# Patient Record
Sex: Male | Born: 1965 | Race: Black or African American | Hispanic: No | Marital: Married | Smoking: Current every day smoker
Health system: Southern US, Community
[De-identification: ages and names within clinical notes are randomized; demographics above are authoritative.]

## PROBLEM LIST (undated history)

## (undated) DIAGNOSIS — K219 Gastro-esophageal reflux disease without esophagitis: Secondary | ICD-10-CM

## (undated) DIAGNOSIS — I10 Essential (primary) hypertension: Secondary | ICD-10-CM

## (undated) DIAGNOSIS — R42 Dizziness and giddiness: Secondary | ICD-10-CM

## (undated) DIAGNOSIS — K635 Polyp of colon: Secondary | ICD-10-CM

## (undated) DIAGNOSIS — R0981 Nasal congestion: Secondary | ICD-10-CM

## (undated) DIAGNOSIS — J309 Allergic rhinitis, unspecified: Secondary | ICD-10-CM

## (undated) DIAGNOSIS — Z8249 Family history of ischemic heart disease and other diseases of the circulatory system: Secondary | ICD-10-CM

## (undated) DIAGNOSIS — H04203 Unspecified epiphora, bilateral lacrimal glands: Secondary | ICD-10-CM

## (undated) HISTORY — DX: Dizziness and giddiness: R42

## (undated) HISTORY — DX: Unspecified epiphora, bilateral: H04.203

## (undated) HISTORY — DX: Nasal congestion: R09.81

## (undated) HISTORY — PX: NO PAST SURGERIES: SHX2092

## (undated) HISTORY — DX: Gastro-esophageal reflux disease without esophagitis: K21.9

## (undated) HISTORY — DX: Allergic rhinitis, unspecified: J30.9

## (undated) HISTORY — DX: Polyp of colon: K63.5

## (undated) HISTORY — DX: Family history of ischemic heart disease and other diseases of the circulatory system: Z82.49

---

## 2000-03-13 ENCOUNTER — Encounter: Admission: RE | Admit: 2000-03-13 | Discharge: 2000-03-13 | Payer: Self-pay | Admitting: General Practice

## 2000-03-13 ENCOUNTER — Encounter: Payer: Self-pay | Admitting: General Practice

## 2000-10-15 ENCOUNTER — Encounter: Admission: RE | Admit: 2000-10-15 | Discharge: 2000-10-15 | Payer: Self-pay | Admitting: Family Medicine

## 2000-10-15 ENCOUNTER — Encounter: Payer: Self-pay | Admitting: Family Medicine

## 2007-09-22 ENCOUNTER — Emergency Department (HOSPITAL_COMMUNITY): Admission: EM | Admit: 2007-09-22 | Discharge: 2007-09-22 | Payer: Self-pay | Admitting: Emergency Medicine

## 2008-03-22 ENCOUNTER — Emergency Department (HOSPITAL_COMMUNITY): Admission: EM | Admit: 2008-03-22 | Discharge: 2008-03-22 | Payer: Self-pay | Admitting: Emergency Medicine

## 2009-07-10 ENCOUNTER — Emergency Department (HOSPITAL_COMMUNITY): Admission: EM | Admit: 2009-07-10 | Discharge: 2009-07-10 | Payer: Self-pay | Admitting: Emergency Medicine

## 2009-10-28 ENCOUNTER — Emergency Department (HOSPITAL_COMMUNITY): Admission: EM | Admit: 2009-10-28 | Discharge: 2009-10-29 | Payer: Self-pay | Admitting: Emergency Medicine

## 2015-03-13 ENCOUNTER — Emergency Department (INDEPENDENT_AMBULATORY_CARE_PROVIDER_SITE_OTHER)
Admission: EM | Admit: 2015-03-13 | Discharge: 2015-03-13 | Disposition: A | Discharge status: Home / Self Care | Payer: Managed Care, Other (non HMO) | Source: Home / Self Care | Attending: Emergency Medicine | Admitting: Emergency Medicine

## 2015-03-13 ENCOUNTER — Encounter (HOSPITAL_COMMUNITY): Payer: Self-pay | Admitting: Emergency Medicine

## 2015-03-13 DIAGNOSIS — L259 Unspecified contact dermatitis, unspecified cause: Secondary | ICD-10-CM | POA: Diagnosis not present

## 2015-03-13 MED ORDER — TRIAMCINOLONE ACETONIDE 0.1 % EX CREA: 1.0000 "application " | TOPICAL_CREAM | Freq: Two times a day (BID) | CUTANEOUS | Status: DC

## 2015-03-13 NOTE — Discharge Instructions (Signed)
You either came into contact with something like poison ivy or some bug bites that you are reacting to. I do not see any sign of a spider bite at this time. Use the triamcinolone cream twice a day to help with the redness and itching. You can use over-the-counter Benadryl cream every 4 hours as needed for itching. It may get a little worse before it gets better. You should see improvement over the next week. Follow-up as needed.

## 2015-03-13 NOTE — ED Provider Notes (Signed)
CSN: [id number]     Arrival date & time 03/13/15  1302 History   First MD Initiated Contact with Patient 03/13/15 1309     Chief Complaint  Patient presents with  . Insect Bite   (Consider location/radiation/quality/duration/timing/severity/associated sxs/prior Treatment) HPI  He is a 50 year old man here for evaluation of itching. He states he woke up this morning and noticed several itchy red spots on his left shin. He denies any fevers or chills. He denies any pain. He states it is itchy at times. He has not tried any medications. 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  History reviewed. No pertinent past medical history. History reviewed. No pertinent past surgical history. History reviewed. No pertinent family history. Social History  Substance Use Topics  . Smoking status: Current Every Day Smoker  . Smokeless tobacco: None  . Alcohol Use: Yes    Review of Systems As in history of present illness Allergies  Review of patient's allergies indicates no known allergies.  Home Medications   Prior to Admission medications   Medication Sig Start Date End Date Taking? Authorizing Provider  triamcinolone cream (KENALOG) 0.1 % Apply 1 application topically 2 (two) times daily. 03/13/15   Charm RingsErin J Honig, MD   Meds Ordered and Administered this Visit  Medications - No data to display  BP 144/94 mmHg  Pulse 92  Temp(Src) 97.5 F (36.4 C) (Oral)  SpO2 96% No data found.   Physical Exam  Constitutional: He is oriented to person, place, and time. He appears well-developed and well-nourished. No distress.  Cardiovascular: Normal rate.   Pulmonary/Chest: Effort normal.  Neurological: He is alert and oriented to person, place, and time.  Skin:  3 erythematous wheals on left shin. They are nontender. No palpable fluctuance.    ED Course  Procedures (including critical care time)  Labs Review Labs Reviewed - No  data to display  Imaging Review No results found.    MDM   1. Contact dermatitis    Suspect either local reaction to bug bite versus contact dermatitis. Triamcinolone cream to help with itching and redness. OTC Benadryl cream as needed for itching. Follow-up as needed.    Charm RingsErin J Honig, MD 03/13/15 1324

## 2015-03-13 NOTE — ED Notes (Signed)
Insect bites on left lower leg.  Patient noticed this today.  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

## 2017-10-09 ENCOUNTER — Encounter: Payer: Self-pay | Admitting: Family Medicine

## 2017-10-09 ENCOUNTER — Ambulatory Visit: Payer: Managed Care, Other (non HMO) | Attending: Family Medicine | Admitting: Family Medicine

## 2017-10-09 VITALS — BP 149/91 | HR 79 | Temp 98.2°F | Resp 18 | Ht 67.0 in | Wt 163.0 lb

## 2017-10-09 DIAGNOSIS — K219 Gastro-esophageal reflux disease without esophagitis: Secondary | ICD-10-CM | POA: Insufficient documentation

## 2017-10-09 DIAGNOSIS — Z8249 Family history of ischemic heart disease and other diseases of the circulatory system: Secondary | ICD-10-CM

## 2017-10-09 DIAGNOSIS — R030 Elevated blood-pressure reading, without diagnosis of hypertension: Secondary | ICD-10-CM | POA: Insufficient documentation

## 2017-10-09 DIAGNOSIS — J309 Allergic rhinitis, unspecified: Secondary | ICD-10-CM | POA: Insufficient documentation

## 2017-10-09 DIAGNOSIS — Z860100 Personal history of colon polyps, unspecified: Secondary | ICD-10-CM

## 2017-10-09 DIAGNOSIS — R42 Dizziness and giddiness: Secondary | ICD-10-CM | POA: Diagnosis not present

## 2017-10-09 DIAGNOSIS — Z79899 Other long term (current) drug therapy: Secondary | ICD-10-CM | POA: Insufficient documentation

## 2017-10-09 DIAGNOSIS — Z800 Family history of malignant neoplasm of digestive organs: Secondary | ICD-10-CM | POA: Insufficient documentation

## 2017-10-09 DIAGNOSIS — K635 Polyp of colon: Secondary | ICD-10-CM | POA: Insufficient documentation

## 2017-10-09 MED ORDER — CETIRIZINE HCL 10 MG PO CAPS: 10.0000 mg | ORAL_CAPSULE | Freq: Every day | ORAL | 6 refills | Status: DC

## 2017-10-09 MED ORDER — ESOMEPRAZOLE MAGNESIUM 20 MG PO CPDR: 20.0000 mg | DELAYED_RELEASE_CAPSULE | Freq: Every day | ORAL | 11 refills | Status: DC

## 2017-10-09 NOTE — Patient Instructions (Signed)
Dizziness Dizziness is a common problem. It makes you feel unsteady or light-headed. You may feel like you are about to pass out (faint). Dizziness can lead to getting hurt if you stumble or fall. Dizziness can be caused by many things, including:  Medicines.  Not having enough water in your body (dehydration).  Illness.  Follow these instructions at home: Eating and drinking  Drink enough fluid to keep your pee (urine) clear or pale yellow. This helps to keep you from getting dehydrated. Try to drink more clear fluids, such as water.  Do not drink alcohol.  Limit how much caffeine you drink or eat, if your doctor tells you to do that.  Limit how much salt (sodium) you drink or eat, if your doctor tells you to do that. Activity  Avoid making quick movements. ? When you stand up from sitting in a chair, steady yourself until you feel okay. ? In the morning, first sit up on the side of the bed. When you feel okay, stand slowly while you hold onto something. Do this until you know that your balance is fine.  If you need to stand in one place for a long time, move your legs often. Tighten and relax the muscles in your legs while you are standing.  Do not drive or use heavy machinery if you feel dizzy.  Avoid bending down if you feel dizzy. Place items in your home so you can reach them easily without leaning over. Lifestyle  Do not use any products that contain nicotine or tobacco, such as cigarettes and e-cigarettes. If you need help quitting, ask your doctor.  Try to lower your stress level. You can do this by using methods such as yoga or meditation. Talk with your doctor if you need help. General instructions  Watch your dizziness for any changes.  Take over-the-counter and prescription medicines only as told by your doctor. Talk with your doctor if you think that you are dizzy because of a medicine that you are taking.  Tell a friend or a family member that you are feeling  dizzy. If he or she notices any changes in your behavior, have this person call your doctor.  Keep all follow-up visits as told by your doctor. This is important. Contact a doctor if:  Your dizziness does not go away.  Your dizziness or light-headedness gets worse.  You feel sick to your stomach (nauseous).  You have trouble hearing.  You have new symptoms.  You are unsteady on your feet.  You feel like the room is spinning. Get help right away if:  You throw up (vomit) or have watery poop (diarrhea), and you cannot eat or drink anything.  You have trouble: ? Talking. ? Walking. ? Swallowing. ? Using your arms, hands, or legs.  You feel generally weak.  You are not thinking clearly, or you have trouble forming sentences. A friend or family member may notice this.  You have: ? Chest pain. ? Pain in your belly (abdomen). ? Shortness of breath. ? Sweating.  Your vision changes.  You are bleeding.  You have a very bad headache.  You have neck pain or a stiff neck.  You have a fever. These symptoms may be an emergency. Do not wait to see if the symptoms will go away. Get medical help right away. Call your local emergency services (911 in the [place] Do not drive yourself to the hospital. Summary  Dizziness makes you feel unsteady or light-headed. You may  feel like you are about to pass out (faint).  Drink enough fluid to keep your pee (urine) clear or pale yellow. Do not drink alcohol.  Avoid making quick movements if you feel dizzy.  Watch your dizziness for any changes. This information is not intended to replace advice given to you by your health care provider. Make sure you discuss any questions you have with your health care provider. Document Released: 12/15/2010 Document Revised: 01/13/2016 Document Reviewed: 01/13/2016 Elsevier Interactive Patient Education  2017 [employer]  Gastroesophageal Reflux Disease, Adult Normally, food travels down the  esophagus and stays in the stomach to be digested. However, when a person has gastroesophageal reflux disease (GERD), food and stomach acid move back up into the esophagus. When this happens, the esophagus becomes sore and inflamed. Over time, GERD can create small holes (ulcers) in the lining of the esophagus. What are the causes? This condition is caused by a problem with the muscle between the esophagus and the stomach (lower esophageal sphincter, or LES). Normally, the LES muscle closes after food passes through the esophagus to the stomach. When the LES is weakened or abnormal, it does not close properly, and that allows food and stomach acid to go back up into the esophagus. The LES can be weakened by certain dietary substances, medicines, and medical conditions, including:  Tobacco use.  Pregnancy.  Having a hiatal hernia.  Heavy alcohol use.  Certain foods and beverages, such as coffee, chocolate, onions, and peppermint.  What increases the risk? This condition is more likely to develop in:  People who have an increased body weight.  People who have connective tissue disorders.  People who use NSAID medicines.  What are the signs or symptoms? Symptoms of this condition include:  Heartburn.  Difficult or painful swallowing.  The feeling of having a lump in the throat.  Abitter taste in the mouth.  Bad breath.  Having a large amount of saliva.  Having an upset or bloated stomach.  Belching.  Chest pain.  Shortness of breath or wheezing.  Ongoing (chronic) cough or a night-time cough.  Wearing away of tooth enamel.  Weight loss.  Different conditions can cause chest pain. Make sure to see your health care provider if you experience chest pain. How is this diagnosed? Your health care provider will take a medical history and perform a physical exam. To determine if you have mild or severe GERD, your health care provider may also monitor how you respond to  treatment. You may also have other tests, including:  An endoscopy toexamine your stomach and esophagus with a small camera.  A test thatmeasures the acidity level in your esophagus.  A test thatmeasures how much pressure is on your esophagus.  A barium swallow or modified barium swallow to show the shape, size, and functioning of your esophagus.  How is this treated? The goal of treatment is to help relieve your symptoms and to prevent complications. Treatment for this condition may vary depending on how severe your symptoms are. Your health care provider may recommend:  Changes to your diet.  Medicine.  Surgery.  Follow these instructions at home: Diet  Follow a diet as recommended by your health care provider. This may involve avoiding foods and drinks such as: ? Coffee and tea (with or without caffeine). ? Drinks that containalcohol. ? Energy drinks and sports drinks. ? Carbonated drinks or sodas. ? Chocolate and cocoa. ? Peppermint and mint flavorings. ? Garlic and onions. ? Horseradish. ?  Spicy and acidic foods, including peppers, chili powder, curry powder, vinegar, hot sauces, and barbecue sauce. ? Citrus fruit juices and citrus fruits, such as oranges, lemons, and limes. ? Tomato-based foods, such as red sauce, chili, salsa, and pizza with red sauce. ? Fried and fatty foods, such as donuts, french fries, potato chips, and high-fat dressings. ? High-fat meats, such as hot dogs and fatty cuts of red and white meats, such as rib eye steak, sausage, ham, and bacon. ? High-fat dairy items, such as whole milk, butter, and cream cheese.  Eat small, frequent meals instead of large meals.  Avoid drinking large amounts of liquid with your meals.  Avoid eating meals during the 2-3 hours before bedtime.  Avoid lying down right after you eat.  Do not exercise right after you eat. General instructions  Pay attention to any changes in your symptoms.  Take  over-the-counter and prescription medicines only as told by your health care provider. Do not take aspirin, ibuprofen, or other NSAIDs unless your health care provider told you to do so.  Do not use any tobacco products, including cigarettes, chewing tobacco, and e-cigarettes. If you need help quitting, ask your health care provider.  Wear loose-fitting clothing. Do not wear anything tight around your waist that causes pressure on your abdomen.  Raise (elevate) the head of your bed 6 inches (15cm).  Try to reduce your stress, such as with yoga or meditation. If you need help reducing stress, ask your health care provider.  If you are overweight, reduce your weight to an amount that is healthy for you. Ask your health care provider for guidance about a safe weight loss goal.  Keep all follow-up visits as told by your health care provider. This is important. Contact a health care provider if:  You have new symptoms.  You have unexplained weight loss.  You have difficulty swallowing, or it hurts to swallow.  You have wheezing or a persistent cough.  Your symptoms do not improve with treatment.  You have a hoarse voice. Get help right away if:  You have pain in your arms, neck, jaw, teeth, or back.  You feel sweaty, dizzy, or light-headed.  You have chest pain or shortness of breath.  You vomit and your vomit looks like blood or coffee grounds.  You faint.  Your stool is bloody or black.  You cannot swallow, drink, or eat. This information is not intended to replace advice given to you by your health care provider. Make sure you discuss any questions you have with your health care provider. Document Released: 10/05/2004 Document Revised: 05/26/2015 Document Reviewed: 04/22/2014 Elsevier Interactive Patient Education  [employer].

## 2017-10-09 NOTE — Progress Notes (Signed)
Subjective:    Patient ID: Joe Hendricks, male    DOB: April 28, 1965, 52 y.o.   MRN: [id number]  HPI 52 year old male new to the practice.  Patient reports past medical history is significant for acid reflux/GERD.  Patient states that he has had EGD approximately 10 years ago as patient's mother died secondary to stomach cancer.  Patient reports that he did have his colonoscopy done last year and had removal of polyps and will need 3-year repeat.  Patient has also had some recent onset of nasal congestion, watery eyes and sensation of fluid in his ears and patient has started taking over-the-counter Zyrtec secondary to allergic rhinitis.  Patient reports also family history significant for his father died secondary to an MI.  Patient's brothers and sisters also have hypertension.  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  Patient is currently separated from his wife.  Patient smokes about 3 cigarettes/day but did smoke about 1/2 pack/day about 15 years ago.  Patient reports that he started smoking at the age of 2.  Patient reports no past surgeries.  Patient reports that he had a normal EGD about 10 years ago due to his mother's history of stomach cancer and patient status post recent colonoscopy with removal of polyps.  Patient on review of systems has occasional cough, watery eyes and reports recurrent episodes of dizziness.  Patient states that the dizziness is brief.  Patient states that the dizziness has occurred randomly for more than a year.  Patient reports that he has never been diagnosed with hypertension.  Patient believes his blood pressure is elevated because of being at the doctor's office.  No Known Allergies     Review of Systems  Constitutional: Positive for fatigue (mild). Negative for chills and fever.  HENT: Positive for congestion, dental problem (has some cavities/broken off teeth; no tooth pain and planning to see the dentist), postnasal drip, rhinorrhea and  sneezing. Negative for ear pain, sore throat, tinnitus, trouble swallowing and voice change.   Respiratory: Positive for cough (occasional non-productive). Negative for shortness of breath.   Cardiovascular: Negative for chest pain, palpitations and leg swelling.  Gastrointestinal: Negative for abdominal pain, blood in stool, constipation and nausea.  Genitourinary: Negative for dysuria, flank pain and frequency.  Musculoskeletal: Negative for arthralgias, back pain and gait problem.  Neurological: Positive for dizziness. Negative for numbness and headaches.       Objective:   Physical Exam BP (!) 149/91 (BP Location: Left Arm, Patient Position: Sitting, Cuff Size: Normal)   Pulse 79   Temp 98.2 F (36.8 C) (Oral)   Resp 18   Ht 5\' 7"  (1.702 m)   Wt 163 lb (73.9 kg)   SpO2 98%   BMI 25.53 kg/m Vital signs and nurse's notes reviewed General-well-nourished, well-developed older male in no acute distress ENT- patient's right TM is dull with positive acute fluid level, left TM dull.  Patient with edema of the nasal turbinates with clear to white nasal discharge, patient with posterior pharynx edema/erythema and cobblestoning. Neck-supple, no lymphadenopathy, no thyromegaly, no carotid bruit Lungs-clear to auscultation bilaterally Cardiovascular-regular rate and rhythm Abdomen- soft, nontender Back-no CVA tenderness Extremities-no edema    Assessment & Plan:  1. Gastroesophageal reflux disease, esophagitis presence not specified Patient reports acid reflux symptoms for which he is taking over-the-counter Nexium about every other day.  Patient is provided with prescription for Nexium 20 mg which he may also take every other day or on a  daily basis if he has increased symptoms.  Patient is encouraged to avoid late night eating as well as avoidance of spicy/greasy foods.  Patient should report any increased reflux symptoms or abdominal pain as he may need GI referral done in follow-up as  patient also with history of mother with stomach cancer.  Patient reports prior normal EGD and patient denies any sore throat or dysphasia. - esomeprazole (NEXIUM) 20 MG capsule; Take 1 capsule (20 mg total) by mouth daily. To reduce stomach acid  Dispense: 30 capsule; Refill: 11  2. Allergic rhinitis, unspecified seasonality, unspecified trigger Patient with allergic rhinitis and does have some fluid behind the right TM on exam.  Patient given prescription for cetirizine 10 mg which he may wish to take on a nightly basis during allergy season.  Patient currently takes medication in the daytime and does report some days when he has drowsiness secondary to use of the medication. - Cetirizine HCl (ZYRTEC ALLERGY) 10 MG CAPS; Take 1 capsule (10 mg total) by mouth daily. For relief of allergy symptoms  Dispense: 30 capsule; Refill: 6  3. Dizziness Patient reports recurrent episodes of dizziness.  Patient does have some fluid behind the right TM on exam which may be contributing to his dizziness.  Patient will also have CBC and BMP to look for other causes of dizziness.  If dizziness continues, patient may need further evaluation such as carotid Dopplers and neurology referral. - CBC with Differential - Basic Metabolic Panel  4. History of colon polyps Patient reports a history of colon polyps found on colonoscopy done last year and patient reports the need for 3-year repeat but he should also report any changes in bowel habits, black/dark stools are blood in the stools which will require sooner evaluation.  5. Family history of heart disease Patient reports family history of heart disease and patient will have lipid panel done at today's visit and patient will be notified if any interventions such as statin medication are recommended based on these results. - Lipid panel  6.  Elevated blood pressure Patient with elevated blood pressure at today's visit of 149/91 with later recheck again being  elevated at 149/91.  Patient does not believe that he has hypertension but believes that the elevated blood pressure secondary to being nervous regarding today's visit.  Patient has been asked to return to clinic in 2 to 3 weeks for blood pressure recheck and if blood pressure still elevated at that time, will again suggest medication in addition to DASH diet as patient's elevated blood pressure may be a source of his dizziness.  *Influenza immunization was offered but declined by the patient at today's visit  An After Visit Summary was printed and given to the patient.  Return in about 6 months (around 04/10/2018) for GERD/Dizziness.

## 2017-10-10 LAB — CBC WITH DIFFERENTIAL/PLATELET
Basophils Absolute: 0 x10E3/uL (ref 0.0–0.2)
Basos: 0 %
EOS (ABSOLUTE): 0 x10E3/uL (ref 0.0–0.4)
Eos: 1 %
Hematocrit: 43 % (ref 37.5–51.0)
Hemoglobin: 14.2 g/dL (ref 13.0–17.7)
Immature Grans (Abs): 0 x10E3/uL (ref 0.0–0.1)
Immature Granulocytes: 0 %
Lymphocytes Absolute: 2 x10E3/uL (ref 0.7–3.1)
Lymphs: 38 %
MCH: 32.7 pg (ref 26.6–33.0)
MCHC: 33 g/dL (ref 31.5–35.7)
MCV: 99 fL — ABNORMAL HIGH (ref 79–97)
Monocytes Absolute: 0.3 x10E3/uL (ref 0.1–0.9)
Monocytes: 6 %
Neutrophils Absolute: 2.9 x10E3/uL (ref 1.4–7.0)
Neutrophils: 55 %
Platelets: 205 x10E3/uL (ref 150–450)
RBC: 4.34 x10E6/uL (ref 4.14–5.80)
RDW: 12.7 % (ref 12.3–15.4)
WBC: 5.2 x10E3/uL (ref 3.4–10.8)

## 2017-10-10 LAB — LIPID PANEL
Chol/HDL Ratio: 12.3 ratio — ABNORMAL HIGH (ref 0.0–5.0)
Cholesterol, Total: 296 mg/dL — ABNORMAL HIGH (ref 100–199)
HDL: 24 mg/dL — ABNORMAL LOW
Triglycerides: 1566 mg/dL (ref 0–149)

## 2017-10-10 LAB — BASIC METABOLIC PANEL WITH GFR
BUN/Creatinine Ratio: 15 (ref 9–20)
BUN: 11 mg/dL (ref 6–24)
CO2: 18 mmol/L — ABNORMAL LOW (ref 20–29)
Calcium: 9 mg/dL (ref 8.7–10.2)
Chloride: 97 mmol/L (ref 96–106)
Creatinine, Ser: 0.72 mg/dL — ABNORMAL LOW (ref 0.76–1.27)
GFR calc Af Amer: 124 mL/min/1.73
GFR calc non Af Amer: 107 mL/min/1.73
Glucose: 121 mg/dL — ABNORMAL HIGH (ref 65–99)
Potassium: 4.4 mmol/L (ref 3.5–5.2)
Sodium: 139 mmol/L (ref 134–144)

## 2017-10-11 ENCOUNTER — Other Ambulatory Visit: Payer: Self-pay | Admitting: Family Medicine

## 2017-10-11 DIAGNOSIS — E785 Hyperlipidemia, unspecified: Secondary | ICD-10-CM

## 2017-10-11 DIAGNOSIS — Z8249 Family history of ischemic heart disease and other diseases of the circulatory system: Secondary | ICD-10-CM

## 2017-10-11 DIAGNOSIS — R42 Dizziness and giddiness: Secondary | ICD-10-CM

## 2017-10-11 NOTE — Progress Notes (Signed)
Patient ID: Joe Hendricks, male   DOB: 1965/05/30, 52 y.o.   MRN: [id number]  Patient was seen recently to establish care at this office.  Patient reported a family history of heart disease and patient had lipid panel done which was very abnormal with triglyceride level of 1,566 and HDL of 24 (LDL could not be calculated due to the high TG level).  Patient also reported episodes of dizziness.  Patient will be referred to cardiology for further evaluation due to cardiac risk factors of gender, age, family history, smoking and lipids.  Patient will also be scheduled for carotid ultrasound in case carotid stenosis is contributing to his current issues with dizziness.  Patient will be notified of the referral and ultrasound scheduling, as well as his abnormal lab results.  I have also sent a message to the clinical pharmacist with him the patient will be meeting so that treatment of his dyslipidemia can be discussed and appropriate medical management initiated.

## 2017-10-15 ENCOUNTER — Telehealth: Payer: Self-pay

## 2017-10-15 ENCOUNTER — Ambulatory Visit (HOSPITAL_COMMUNITY)
Admission: RE | Admit: 2017-10-15 | Discharge: 2017-10-15 | Disposition: A | Discharge status: Home / Self Care | Payer: Managed Care, Other (non HMO) | Source: Ambulatory Visit | Attending: Family Medicine | Admitting: Family Medicine

## 2017-10-15 DIAGNOSIS — E785 Hyperlipidemia, unspecified: Secondary | ICD-10-CM | POA: Insufficient documentation

## 2017-10-15 DIAGNOSIS — R42 Dizziness and giddiness: Secondary | ICD-10-CM | POA: Insufficient documentation

## 2017-10-15 DIAGNOSIS — I6523 Occlusion and stenosis of bilateral carotid arteries: Secondary | ICD-10-CM | POA: Diagnosis not present

## 2017-10-15 NOTE — Telephone Encounter (Signed)
Spoke to patient at work and gave him all of the information in Dr [employer] note. Pt said at the end that he walks a great deal and on [place] he walks and hour a day. Nurse questioned diet.  He said he did not follow any diet.  He admitted to eating "a lot" of fried food.  Brief education on diet given.  Will get diet information for pt. Reviewed Vasculalar ultrasound appt at [facility] today at 1:00pm and next week appt with pharmacist. Mr Godek voiced understanding of information we discussed.

## 2017-10-15 NOTE — Telephone Encounter (Signed)
This is an addendum to note for same telephone call.  Educated on result of cholesterol that was over 1500 and the risks of high cholesterol.  Pt was able to teach back information and seemed quite serious about dealing with this problem.

## 2017-10-15 NOTE — Telephone Encounter (Deleted)
ERROR

## 2017-10-15 NOTE — Progress Notes (Signed)
Carotid artery duplex has been completed. 1-39% ICA stenosis bilaterally.  10/15/17 1:26 PM Olen Cordial RVT

## 2017-10-17 ENCOUNTER — Telehealth: Payer: Self-pay

## 2017-10-17 ENCOUNTER — Telehealth: Payer: Self-pay | Admitting: *Deleted

## 2017-10-17 NOTE — Telephone Encounter (Signed)
MA unable to reach patient due to no VM option being available. Please inform patient of cholesterol level being abnormally high and needing to see an cardiologist. Patient should begin taking an OTC B complex multivitamin. Patient has an appointment with vascular on 10/19/17.

## 2017-10-17 NOTE — Telephone Encounter (Signed)
-----   Message from Cain Saupe, MD sent at 10/15/2017  5:23 PM EDT ----- Notify patient that his carotid ultrasound did not show any significant stenosis.

## 2017-10-17 NOTE — Telephone Encounter (Signed)
Spoke to pharmacist Edythe Lynn about pt's triglycerides and need for diabetic teaching.  He said pt coming in for BP check with him in about 2 weeks and he will add basic diet education to the appointment.  Triage nurse mailing diabetic diet  teaching tools today.  Pt works during the day and msg left at work.

## 2017-10-17 NOTE — Telephone Encounter (Signed)
-----   Message from Cain Saupe, MD sent at 10/11/2017  9:29 PM EDT ----- Please let patient know that his lipid panel was abnormal and his triglycerides are very high. A normal triglyceride level would be 150 or less and his level is 1,566. I would like for him to see a cardiologist due to his abnormal lipids and family history of heart disease as well as order an ultrasound test of his carotid arteries due to his complaints of dizziness. If he was fasting at the time of the blood work then his blood sugar was a little above normal at 121. Will recheck at his follow-up. His CBC was normal except for a mild increase in cell size and he may want to start a B complex multivitamin

## 2017-10-17 NOTE — Telephone Encounter (Signed)
MA unable to reach patient. Patients US showed no abnormalities.

## 2017-10-19 ENCOUNTER — Ambulatory Visit: Payer: Managed Care, Other (non HMO) | Admitting: Cardiology

## 2017-10-19 ENCOUNTER — Encounter: Payer: Self-pay | Admitting: Cardiology

## 2017-10-19 VITALS — BP 138/80 | HR 78 | Ht 61.0 in | Wt 166.6 lb

## 2017-10-19 DIAGNOSIS — Z720 Tobacco use: Secondary | ICD-10-CM | POA: Diagnosis not present

## 2017-10-19 DIAGNOSIS — E782 Mixed hyperlipidemia: Secondary | ICD-10-CM | POA: Diagnosis not present

## 2017-10-19 DIAGNOSIS — Z8249 Family history of ischemic heart disease and other diseases of the circulatory system: Secondary | ICD-10-CM

## 2017-10-19 DIAGNOSIS — E785 Hyperlipidemia, unspecified: Secondary | ICD-10-CM

## 2017-10-19 DIAGNOSIS — I6523 Occlusion and stenosis of bilateral carotid arteries: Secondary | ICD-10-CM

## 2017-10-19 MED ORDER — ROSUVASTATIN CALCIUM 20 MG PO TABS: 20.0000 mg | ORAL_TABLET | Freq: Every day | ORAL | 3 refills | Status: DC

## 2017-10-19 MED ORDER — ICOSAPENT ETHYL 1 G PO CAPS: 2.0000 g | ORAL_CAPSULE | Freq: Two times a day (BID) | ORAL | 3 refills | Status: DC

## 2017-10-19 NOTE — Patient Instructions (Signed)
Medication Instructions:  1) START CRESTOR 20 mg daily 2) START VASCEPA 2g twice daily   Labwork: Your provider recommends that you schedule a follow-up fasting lab appointment in 2 MONTHS.  Testing/Procedures: None  Follow-Up: Your provider recommends that you schedule a follow-up appointment in 2 MONTHS with the Lipid Clinic at [facility].   Any Other Special Instructions Will Be Listed Below (If Applicable).     If you need a refill on your cardiac medications before your next appointment, please call your pharmacy.

## 2017-10-19 NOTE — Progress Notes (Signed)
Cardiology Office Note:    Date:  10/19/2017   ID:  Joe Hendricks, DOB Aug 13, 1965, MRN [id number]  PCP:  Joe Saupe, MD  Cardiologist:  No primary care provider on file.  Electrophysiologist:  None   Referring MD: Joe Saupe, MD     History of Present Illness:    Joe Hendricks is a 52 y.o. male here for evaluation of family history of heart disease with hyperlipidemia.  Has acid reflux/GERD mother died of stomach cancer colonoscopy performed removal of polyps father died of MI.  Brothers and sisters have hypertension.  Works in [employer].  Separated from wife.  Smokes tobacco 3 cigarettes a day.  Normal EGD 10 years ago.  Brief episodes of dizziness occurring randomly.  Nexium was given.  Past Medical History:  Diagnosis Date  . Allergic rhinitis   . Colon polyps   . Dizziness   . Family history of heart disease   . GERD (gastroesophageal reflux disease)   . Nasal congestion   . Watery eyes     Past Surgical History:  Procedure Laterality Date  . NO PAST SURGERIES      Current Medications: Current Meds  Medication Sig  . aspirin EC 81 MG tablet Take 81 mg by mouth daily.  . Cetirizine HCl (ZYRTEC ALLERGY) 10 MG CAPS Take 1 capsule (10 mg total) by mouth daily. For relief of allergy symptoms  . esomeprazole (NEXIUM) 20 MG capsule Take 1 capsule (20 mg total) by mouth daily. To reduce stomach acid  . Garlic 1000 MG CAPS Take 1,000 mg by mouth daily.  . Multiple Vitamins-Minerals (AIRBORNE GUMMIES) CHEW Chew 1 each by mouth daily.  . [DISCONTINUED] omega-3 acid ethyl esters (LOVAZA) 1 g capsule Take 1,600 mg by mouth daily.     Allergies:   Patient has no allergy information on record.   Social History   Socioeconomic History  . Marital status: Married    Spouse name: Not on file  . Number of children: Not on file  . Years of education: Not on file  . Highest education level: Not on file  Occupational History  . Not on file  Social Needs  . Financial  resource strain: Not on file  . Food insecurity:    Worry: Not on file    Inability: Not on file  . Transportation needs:    Medical: Not on file    Non-medical: Not on file  Tobacco Use  . Smoking status: Current Every Day Smoker    Packs/day: 0.25    Types: Cigarettes  . Smokeless tobacco: Never Used  Substance and Sexual Activity  . Alcohol use: Yes  . Drug use: Not on file  . Sexual activity: Yes  Lifestyle  . Physical activity:    Days per week: Not on file    Minutes per session: Not on file  . Stress: Not on file  Relationships  . Social connections:    Talks on phone: Not on file    Gets together: Not on file    Attends religious service: Not on file    Active member of club or organization: Not on file    Attends meetings of clubs or organizations: Not on file    Relationship status: Not on file  Other Topics Concern  . Not on file  Social History Narrative  . Not on file     Family History: The patient's family history includes Cancer in his mother; Heart attack in his father; Hypercalcemia  in his brother; Hypertension in his brother and sister.  ROS:   Please see the history of present illness.     All other systems reviewed and are negative.  EKGs/Labs/Other Studies Reviewed:    The following studies were reviewed today: Prior office notes, lab work, EKG  EKG:  EKG is  ordered today.  The ekg ordered today demonstrates normal sinus rhythm 73 no other abnormalities.  Recent Labs: 10/09/2017: BUN 11; Creatinine, Ser 0.72; Hemoglobin 14.2; Platelets 205; Potassium 4.4; Sodium 139  Recent Lipid Panel    Component Value Date/Time   CHOL 296 (H) 10/09/2017 1103   TRIG 1,566 (HH) 10/09/2017 1103   HDL 24 (L) 10/09/2017 1103   CHOLHDL 12.3 (H) 10/09/2017 1103   LDLCALC Comment 10/09/2017 1103    Physical Exam:    VS:  BP 138/80   Pulse 78   Ht 5\' 1"  (1.549 m)   Wt 166 lb 9.6 oz (75.6 kg)   SpO2 99%   BMI 31.48 kg/m     Wt Readings from Last 3  Encounters:  10/19/17 166 lb 9.6 oz (75.6 kg)  10/09/17 163 lb (73.9 kg)     GEN:  Well nourished, well developed in no acute distress HEENT: Normal NECK: No JVD; No carotid bruits LYMPHATICS: No lymphadenopathy CARDIAC: RRR, no murmurs, rubs, gallops RESPIRATORY:  Clear to auscultation without rales, wheezing or rhonchi  ABDOMEN: Soft, non-tender, non-distended MUSCULOSKELETAL:  No edema; No deformity  SKIN: Warm and dry NEUROLOGIC:  Alert and oriented x 3 PSYCHIATRIC:  Normal affect   ASSESSMENT:    1. Dyslipidemia   2. Mixed hyperlipidemia   3. Family history of early CAD   4. Tobacco use   5. Atherosclerosis of both carotid arteries    PLAN:    In order of problems listed above:  Mixed hyperlipidemia - Total cholesterol 296, HDL 24, LDL not able to be calculated, triglycerides were 1566.  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  Of course, decrease fatty foods, decrease alcohol intake, decrease sugars.  Random glucose previously was 121.  He is never had pancreatitis.  Given his carotid artery plaque, we will start Crestor 20 mg once a day.  We will also start Vascepa 2g PO BID.  Hopefully given his carotid artery disease and early family history of CAD as well as triglycerides well over 500 insurance company will approve this medication for him.  If not, Lovaza  Strong family history of CAD - Father 44 MI, risk factor prevention.  Bilateral carotid artery plaque -Mild 1 to 39% bilaterally, starting statin and lowering triglycerides as well.  Tobacco use -Continue to encourage cessation.  Dizziness - Seems benign at this point.  EKG reassuring.  No frank syncope.  Likely multifactorial.  We will see him back in 2 months check lipid panel and ALT.  At that point we will have him see lipid clinic.  Medication Adjustments/Labs and Tests Ordered: Current medicines are reviewed at length with the patient today.  Concerns regarding medicines are outlined above.  Orders  Placed This Encounter  Procedures  . Lipid panel  . ALT  . AMB Referral to [facility]   Meds ordered this encounter  Medications  . rosuvastatin (CRESTOR) 20 MG tablet    Sig: Take 1 tablet (20 mg total) by mouth daily.    Dispense:  90 tablet    Refill:  3  . Icosapent Ethyl (VASCEPA) 1 g CAPS    Sig: Take 2 capsules (2 g total)  by mouth 2 (two) times daily.    Dispense:  360 capsule    Refill:  3    Patient Instructions  Medication Instructions:  1) START CRESTOR 20 mg daily 2) START VASCEPA 2g twice daily   Labwork: Your provider recommends that you schedule a follow-up fasting lab appointment in 2 MONTHS.  Testing/Procedures: None  Follow-Up: Your provider recommends that you schedule a follow-up appointment in 2 MONTHS with the Lipid Clinic at [facility].   Any Other Special Instructions Will Be Listed Below (If Applicable).     If you need a refill on your cardiac medications before your next appointment, please call your pharmacy.      Signed, Donato Schultz, MD  10/19/2017 10:37 AM    [facility]

## 2017-10-22 ENCOUNTER — Telehealth: Payer: Self-pay | Admitting: Cardiology

## 2017-10-22 NOTE — Telephone Encounter (Signed)
New Message   Pt c/o medication issue:  1. Name of Medication:Icosapent Ethyl (VASCEPA) 1 g CAPS  2. How are you currently taking this medication (dosage and times per day)? Take 2 capsules (2 g total) by mouth 2 (two) times daily.  3. Are you having a reaction (difficulty breathing--STAT)? no  4. What is your medication issue? Pt states that this medication is too expensive, his insurance will not cover it and wants to know what his options are. Please call

## 2017-10-22 NOTE — Telephone Encounter (Signed)
Spoke to patient who called to inform us that his Vascepa medication will cost him $300, apparently not covered by insurance and not affordable.  The alternative listed in the notes was Lavaza.  Should we try this?  Thank you.

## 2017-10-23 ENCOUNTER — Encounter: Payer: Managed Care, Other (non HMO) | Admitting: Pharmacist

## 2017-10-23 NOTE — Addendum Note (Signed)
Addended by: Kerrie Buffalo on: 10/23/2017 12:32 PM   Modules accepted: Orders

## 2017-10-23 NOTE — Progress Notes (Deleted)
   S:    Patient arrives ***.    Presents to the clinic for hypertension evaluation, counseling, and management. Patient was referred by Dr. Marland Kitchen on ***.  Patient was last seen by Primary Care Provider on ***. Last Rx Clinic visit *** - at that time ***.  Patient {Actions; denies-reports:120008} adherence with medications.  Current BP Medications include:  ***  Antihypertensives tried in the past include: ***  Dietary habits include: *** Exercise habits include:*** Family / Social history: ***  ASCVD risk factors include:***  Home BP readings: ***   .[email or web address]   O:  Physical Exam   ROS  Last 3 Office BP readings: BP Readings from Last 3 Encounters:  10/19/17 138/80  10/09/17 (!) 149/91  03/13/15 144/94    BMET    Component Value Date/Time   NA 139 10/09/2017 1103   K 4.4 10/09/2017 1103   CL 97 10/09/2017 1103   CO2 18 (L) 10/09/2017 1103   GLUCOSE 121 (H) 10/09/2017 1103   BUN 11 10/09/2017 1103   CREATININE 0.72 (L) 10/09/2017 1103   CALCIUM 9.0 10/09/2017 1103   GFRNONAA 107 10/09/2017 1103   GFRAA 124 10/09/2017 1103    Renal function: Estimated Creatinine Clearance: 94.1 mL/min (A) (by C-G formula based on SCr of 0.72 mg/dL (L)).  A/P: Hypertension longstanding/newly diagnosed currently *** on current medications. BP Goal = *** mmHg. Patient {Is/is not:9024} adherent with current medications.  -{Meds adjust:18428} ***.  -F/u labs ordered - *** -Counseled on lifestyle modifications for blood pressure control including reduced dietary sodium, increased exercise, adequate sleep  Results reviewed and written information provided.   Total time in face-to-face counseling *** minutes.   F/U Clinic Visit in ***.  Patient seen with ***

## 2017-10-24 ENCOUNTER — Ambulatory Visit: Payer: Managed Care, Other (non HMO) | Attending: Family Medicine | Admitting: Pharmacist

## 2017-10-24 ENCOUNTER — Encounter: Payer: Self-pay | Admitting: Pharmacist

## 2017-10-24 VITALS — BP 148/92 | HR 94

## 2017-10-24 DIAGNOSIS — I10 Essential (primary) hypertension: Secondary | ICD-10-CM | POA: Insufficient documentation

## 2017-10-24 DIAGNOSIS — R030 Elevated blood-pressure reading, without diagnosis of hypertension: Secondary | ICD-10-CM

## 2017-10-24 MED ORDER — OMEGA-3-ACID ETHYL ESTERS 1 G PO CAPS: 2.0000 g | ORAL_CAPSULE | Freq: Two times a day (BID) | ORAL | 3 refills | Status: AC

## 2017-10-24 MED ORDER — AMLODIPINE BESYLATE 5 MG PO TABS: 5.0000 mg | ORAL_TABLET | Freq: Every day | ORAL | 3 refills | Status: DC

## 2017-10-24 NOTE — Patient Instructions (Signed)
Thank you for coming to see [name] today.   Blood pressure today is elevated.   Start taking amlodipine 5 mg before bedtime as prescribed.   Limiting salt and caffeine, as well as exercising as able for at least 30 minutes for 5 days out of the week, can also help you lower your blood pressure.  Take your blood pressure at home if you are able. Please write down these numbers and bring them to your visits.  If you have any questions about medications, please call me [phone].  Joe Hendricks

## 2017-10-24 NOTE — Progress Notes (Signed)
   S:    PCP: Dr. Jillyn Hidden  Patient arrives in good spirits.  Presents to the clinic for hypertension evaluation, counseling, and management. Patient was referred by Dr. Jillyn Hidden on 10/09/17.  Patient does not have dx of HTN. However, PCP noted at last visit that if today's BP is elevated, she will dx ad start medication for HTN.    Patient does not currently take medications for BP.   Current BP Medications include:   - none  Antihypertensives tried in the past include:  - none  Dietary habits include:  - Patient does not limit salt - Reports 1 cup of coffee daily Exercise habits include: - reports >30 minutes daily - "I walk 3-4 miles every day" Family / Social history:  - Tobacco: reports 1 cigarette a day - Alcohol: 1 standard drink daily  Home BP readings:  - Reports having meter but does not monitor at home  O:  L arm after 5 minutes rest: 148/92, HR 94  Last 3 Office BP readings: BP Readings from Last 3 Encounters:  10/19/17 138/80  10/09/17 (!) 149/91  03/13/15 144/94    BMET    Component Value Date/Time   NA 139 10/09/2017 1103   K 4.4 10/09/2017 1103   CL 97 10/09/2017 1103   CO2 18 (L) 10/09/2017 1103   GLUCOSE 121 (H) 10/09/2017 1103   BUN 11 10/09/2017 1103   CREATININE 0.72 (L) 10/09/2017 1103   CALCIUM 9.0 10/09/2017 1103   GFRNONAA 107 10/09/2017 1103   GFRAA 124 10/09/2017 1103    Renal function: Estimated Creatinine Clearance: 94.1 mL/min (A) (by C-G formula based on SCr of 0.72 mg/dL (L)).  A/P: Hypertension undiagnosed currently not on current medications. I have forwarded BP readings to PCP for dx. Per her documentation on 10/09/17, pt okay to start medications at this time. I have forwarded her this information. BP Goal <130/80 mmHg. Patient does not currently take medications.   -Started amlodipine 5 mg .  -Counseled on lifestyle modifications for blood pressure control including reduced dietary sodium, increased exercise, adequate  sleep  Results reviewed and written information provided.   Total time in face-to-face counseling 15 minutes.   F/U Clinic Visit in ~1 month.    Patient seen with:  Leanne Chang, PharmD Candidate [employer] of 2021  Butch Penny, PharmD, CPP Clinical Pharmacist [facility] [phone]

## 2017-10-24 NOTE — Telephone Encounter (Signed)
Spoke to patient and gave him Dr Anne Fu' recommendation. We will start him on Lovaza 2g by mouth bid.  The patient will call us if he has issues with the medication.

## 2017-10-24 NOTE — Telephone Encounter (Signed)
OK to use Lovaza 2g PO BID Thanks Donato Schultz, MD

## 2017-10-25 ENCOUNTER — Encounter: Payer: Self-pay | Admitting: Family Medicine

## 2017-10-25 DIAGNOSIS — I10 Essential (primary) hypertension: Secondary | ICD-10-CM | POA: Insufficient documentation

## 2017-11-26 ENCOUNTER — Ambulatory Visit: Payer: Managed Care, Other (non HMO) | Admitting: Pharmacist

## 2017-11-26 NOTE — Progress Notes (Signed)
   S:    PCP: Dr. Jillyn HiddenFulp  Patient arrives in good spirits.  Presents to the clinic for hypertension management. Patient was referred by Dr. Jillyn HiddenFulp on 10/09/17. I last saw him 10/24/17. Started amlodipine 5 mg daily.   Patient reports adherence to medications. Denies chest pain, shortness of breath, headache or dizziness. Denies BLE edema.   Current BP Medications include:   - amlodipine 5 mg daily  Antihypertensives tried in the past include:  - none  Dietary habits include: limits salt and drinks 1-2 cups of coffee a week Exercise habits include: "I walk 3-4 miles every day" Family / Social history: MI (father at 52 YO), HTN (brother, sister), smokes 1-2 cigarettes/day, and 1 beer "every once in a while"   Home BP readings:  - Reports high 130s/80s  O:  L arm after 5 minutes rest: 120/76, HR 99  Last 3 Office BP readings: BP Readings from Last 3 Encounters:  10/24/17 (!) 148/92  10/19/17 138/80  10/09/17 (!) 149/91   BMET    Component Value Date/Time   NA 139 10/09/2017 1103   K 4.4 10/09/2017 1103   CL 97 10/09/2017 1103   CO2 18 (L) 10/09/2017 1103   GLUCOSE 121 (H) 10/09/2017 1103   BUN 11 10/09/2017 1103   CREATININE 0.72 (L) 10/09/2017 1103   CALCIUM 9.0 10/09/2017 1103   GFRNONAA 107 10/09/2017 1103   GFRAA 124 10/09/2017 1103   Renal function: CrCl cannot be calculated (Patient's most recent lab result is older than the maximum 21 days allowed.).  A/P: Hypertension longstanding currently controlled on amlodipine 5 mg daily. BP goal <130/80. Pt is adherent to medication.   -Continued amlodipine 5 mg daily -Counseled on lifestyle modifications for blood pressure control including reduced dietary sodium, increased exercise, adequate sleep  Results reviewed and written information provided. Total time in face-to-face counseling 15 minutes.   F/U with PCP.  Butch PennyLuke Van Ausdall, PharmD, CPP Clinical Pharmacist [facility] [phone]

## 2017-11-26 NOTE — Progress Notes (Deleted)
   S:    PCP: Dr. Jillyn HiddenFulp  Patient arrives *** spirits.  Presents to the clinic for hypertension management. Patient was referred by Dr. Jillyn HiddenFulp on 10/09/17. I last saw him 10/24/17. Started amlodipine 5 mg daily.   Patient *** adherence to medications. *** chest pain, shortness of breath, headache or dizziness. *** BLE edema.   Current BP Medications include:   - amlodipine 5 mg daily  Antihypertensives tried in the past include:  - none  Dietary habits include:  *** changes - Patient does not limit salt - Reports 1 cup of coffee daily Exercise habits include: - reports >30 minutes daily - "I walk 3-4 miles every day" Family / Social history:  - MI (father at 52 YO), HTN (brother, sister) - Tobacco: *** - Alcohol: ***  Home BP readings:  - ***  O:  L arm after 5 minutes rest: ***, HR ***  Last 3 Office BP readings: BP Readings from Last 3 Encounters:  10/24/17 (!) 148/92  10/19/17 138/80  10/09/17 (!) 149/91    BMET    Component Value Date/Time   NA 139 10/09/2017 1103   K 4.4 10/09/2017 1103   CL 97 10/09/2017 1103   CO2 18 (L) 10/09/2017 1103   GLUCOSE 121 (H) 10/09/2017 1103   BUN 11 10/09/2017 1103   CREATININE 0.72 (L) 10/09/2017 1103   CALCIUM 9.0 10/09/2017 1103   GFRNONAA 107 10/09/2017 1103   GFRAA 124 10/09/2017 1103    Renal function: CrCl cannot be calculated (Patient's most recent lab result is older than the maximum 21 days allowed.).  A/P: Hypertension newly dx currently *** on amlodipine 5 mg daily. BP goal <130/80. Pt *** adherent to medication.   - *** -Counseled on lifestyle modifications for blood pressure control including reduced dietary sodium, increased exercise, adequate sleep  Results reviewed and written information provided. Total time in face-to-face counseling *** minutes.   F/U Clinic Visit ***  Patient seen with:  Leanne ChangJane Chu, PharmD Candidate [employer] of 2021  Butch PennyLuke Van Ausdall, PharmD,  CPP Clinical Pharmacist [facility] [phone]

## 2017-11-27 ENCOUNTER — Encounter: Payer: Self-pay | Admitting: Pharmacist

## 2017-11-27 ENCOUNTER — Ambulatory Visit: Payer: Managed Care, Other (non HMO) | Attending: Family Medicine | Admitting: Pharmacist

## 2017-11-27 VITALS — BP 120/76 | HR 99

## 2017-11-27 DIAGNOSIS — Z79899 Other long term (current) drug therapy: Secondary | ICD-10-CM | POA: Insufficient documentation

## 2017-11-27 DIAGNOSIS — I10 Essential (primary) hypertension: Secondary | ICD-10-CM | POA: Insufficient documentation

## 2017-11-27 DIAGNOSIS — F17210 Nicotine dependence, cigarettes, uncomplicated: Secondary | ICD-10-CM | POA: Insufficient documentation

## 2017-11-27 DIAGNOSIS — Z8249 Family history of ischemic heart disease and other diseases of the circulatory system: Secondary | ICD-10-CM | POA: Insufficient documentation

## 2017-11-27 NOTE — Patient Instructions (Signed)
Thank you for coming to see us today.   Blood pressure today is well-controlled!  Continue taking amlodipine daily.   Limiting salt and caffeine, as well as exercising as able for at least 30 minutes for 5 days out of the week, can also help you lower your blood pressure.  Take your blood pressure at home if you are able. Please write down these numbers and bring them to your visits.  If you have any questions about medications, please call me [phone].  Franky MachoLuke

## 2017-12-24 ENCOUNTER — Other Ambulatory Visit: Payer: Managed Care, Other (non HMO)

## 2017-12-24 NOTE — Progress Notes (Deleted)
Patient ID: Joe Hendricks                 DOB: 1965-10-27                    MRN: [id number]     HPI: Joe Hendricks is a 52 y.o. male patient referred to lipid clinic by Dr. Anne FuSkains PMH is significant for hyperlipidemia, family hx of heart disease, HTN, 1-39% carotid artery plaque  Current Medications: Crestor 20mg , Lovaza 2000mg  BID (vascepa was $300 w/ insurance) Intolerances:  Risk Factors: Family hx, HTN LDL goal: <100  Diet:   Exercise:   Family History:   Social History: + tobacco  Labs:  Past Medical History:  Diagnosis Date  . Allergic rhinitis   . Colon polyps   . Dizziness   . Family history of heart disease   . GERD (gastroesophageal reflux disease)   . Nasal congestion   . Watery eyes     Current Outpatient Medications on File Prior to Visit  Medication Sig Dispense Refill  . amLODipine (NORVASC) 5 MG tablet Take 1 tablet (5 mg total) by mouth daily. 90 tablet 3  . aspirin EC 81 MG tablet Take 81 mg by mouth daily.    . Cetirizine HCl (ZYRTEC ALLERGY) 10 MG CAPS Take 1 capsule (10 mg total) by mouth daily. For relief of allergy symptoms 30 capsule 6  . esomeprazole (NEXIUM) 20 MG capsule Take 1 capsule (20 mg total) by mouth daily. To reduce stomach acid 30 capsule 11  . Garlic 1000 MG CAPS Take 1,000 mg by mouth daily.    . Multiple Vitamins-Minerals (AIRBORNE GUMMIES) CHEW Chew 1 each by mouth daily.    Marland Kitchen. omega-3 acid ethyl esters (LOVAZA) 1 g capsule Take 2 capsules (2 g total) by mouth 2 (two) times daily. 180 capsule 3  . rosuvastatin (CRESTOR) 20 MG tablet Take 1 tablet (20 mg total) by mouth daily. 90 tablet 3   No current facility-administered medications on file prior to visit.     Not on File  Assessment/Plan:  1. Hyperlipidemia - need labs to evaluate crestor/lovaza   Thank you,  Olene FlossMelissa D Sofija Antwi, Pharm.D, BCPS Clinical Pharmacist

## 2017-12-25 ENCOUNTER — Ambulatory Visit: Payer: Managed Care, Other (non HMO)

## 2018-06-11 ENCOUNTER — Other Ambulatory Visit: Payer: Self-pay

## 2018-06-11 ENCOUNTER — Ambulatory Visit (HOSPITAL_COMMUNITY)
Admission: EM | Admit: 2018-06-11 | Discharge: 2018-06-11 | Disposition: A | Discharge status: Home / Self Care | Payer: Managed Care, Other (non HMO) | Attending: Family Medicine | Admitting: Family Medicine

## 2018-06-11 ENCOUNTER — Encounter (HOSPITAL_COMMUNITY): Payer: Self-pay | Admitting: Emergency Medicine

## 2018-06-11 DIAGNOSIS — S46811A Strain of other muscles, fascia and tendons at shoulder and upper arm level, right arm, initial encounter: Secondary | ICD-10-CM

## 2018-06-11 HISTORY — DX: Essential (primary) hypertension: I10

## 2018-06-11 MED ORDER — CYCLOBENZAPRINE HCL 10 MG PO TABS: ORAL_TABLET | ORAL | 0 refills | Status: DC

## 2018-06-11 MED ORDER — NAPROXEN 500 MG PO TABS: 500.0000 mg | ORAL_TABLET | Freq: Two times a day (BID) | ORAL | 0 refills | Status: DC

## 2018-06-11 NOTE — ED Provider Notes (Signed)
[facility]   [id number] 06/11/18 Arrival Time: 1401  ASSESSMENT & PLAN:  1. Trapezius strain, right, initial encounter    Meds ordered this encounter  Medications  . naproxen (NAPROSYN) 500 MG tablet    Sig: Take 1 tablet (500 mg total) by mouth 2 (two) times daily.    Dispense:  20 tablet    Refill:  0  . cyclobenzaprine (FLEXERIL) 10 MG tablet    Sig: Take 1 tablet by mouth before bed as needed for muscle spasm. Warning: May cause drowsiness.    Dispense:  10 tablet    Refill:  0   ROM as tolerated.  Follow-up Information    [facility].   Why:  If not improving over the next week. Contact information: [address] [place] [phone]         Reviewed expectations re: course of current medical issues. Questions answered. Outlined signs and symptoms indicating need for more acute intervention. Patient verbalized understanding. After Visit Summary given.  SUBJECTIVE: History from: patient. Joe Hendricks is a 53 y.o. male who reports persistent moderate pain of his right upper back/shoulder; described as aching but stiff; without radiation. Onset: gradual, yesterday. Injury/trama: no, but reports noticing after work; no heavy lifting "but doing a lot with my arms". Symptoms have progressed to a point and plateaued since beginning. Aggravating factors: certain movements. Alleviating factors: rest. Associated symptoms: none reported. Extremity sensation changes or weakness: none. Self treatment: has not tried OTCs for relief of pain. History of similar: no. No associated CP or SOB. No n/v.  Past Surgical History:  Procedure Laterality Date  . NO PAST SURGERIES       ROS: As per HPI.   OBJECTIVE:  Vitals:   06/11/18 1440  BP: (!) 147/104  Pulse: (!) 102  Resp: 18  Temp: 98.1 F (36.7 C)  TempSrc: Oral  SpO2: 99%    Recheck pulse: 96  General appearance: alert; no  distress HEENT: [place]; AT Neck: supple with FROM; no midline TTP CV: RRR Resp: unlabored respirations Back: tender over R trapezius Ext: R shoulder with normal ROM and strength Skin: warm and dry; no visible rashes Neurologic: gait normal; normal reflexes of RUE and LUE; normal sensation of RUE and LUE; normal strength of RUE and LUE Psychological: alert and cooperative; normal mood and affect   No Known Allergies  Past Medical History:  Diagnosis Date  . Allergic rhinitis   . Colon polyps   . Dizziness   . Family history of heart disease   . GERD (gastroesophageal reflux disease)   . Hypertension   . Nasal congestion   . Watery eyes    Social History   Socioeconomic History  . Marital status: Married    Spouse name: Not on file  . Number of children: Not on file  . Years of education: Not on file  . Highest education level: Not on file  Occupational History  . Not on file  Social Needs  . Financial resource strain: Not on file  . Food insecurity:    Worry: Not on file    Inability: Not on file  . Transportation needs:    Medical: Not on file    Non-medical: Not on file  Tobacco Use  . Smoking status: Current Every Day Smoker    Packs/day: 0.25    Types: Cigarettes  . Smokeless tobacco: Never Used  Substance and Sexual Activity  .  Alcohol use: Yes  . Drug use: Not on file  . Sexual activity: Yes  Lifestyle  . Physical activity:    Days per week: Not on file    Minutes per session: Not on file  . Stress: Not on file  Relationships  . Social connections:    Talks on phone: Not on file    Gets together: Not on file    Attends religious service: Not on file    Active member of club or organization: Not on file    Attends meetings of clubs or organizations: Not on file    Relationship status: Not on file  Other Topics Concern  . Not on file  Social History Narrative  . Not on file   Family History  Problem Relation Age of Onset  . Cancer Mother         STOMACH..COLON POLYPS  . Heart attack Father   . Hypertension Sister   . Hypercalcemia Brother   . Hypertension Brother    Past Surgical History:  Procedure Laterality Date  . NO PAST SURGERIES        Mardella Layman, MD 06/12/18 [id number]

## 2018-06-11 NOTE — ED Triage Notes (Signed)
Pt presents to [facility] for assessment of neck pain and right shoulder pain, especially with lifting his arm.  Started yesterday after work.

## 2018-06-15 ENCOUNTER — Emergency Department (HOSPITAL_COMMUNITY): Payer: Self-pay

## 2018-06-15 ENCOUNTER — Emergency Department (HOSPITAL_COMMUNITY)
Admission: EM | Admit: 2018-06-15 | Discharge: 2018-06-15 | Disposition: A | Discharge status: Home / Self Care | Payer: Self-pay | Attending: Emergency Medicine | Admitting: Emergency Medicine

## 2018-06-15 ENCOUNTER — Encounter (HOSPITAL_COMMUNITY): Payer: Self-pay

## 2018-06-15 ENCOUNTER — Other Ambulatory Visit: Payer: Self-pay

## 2018-06-15 DIAGNOSIS — Z7982 Long term (current) use of aspirin: Secondary | ICD-10-CM | POA: Insufficient documentation

## 2018-06-15 DIAGNOSIS — Z79899 Other long term (current) drug therapy: Secondary | ICD-10-CM | POA: Insufficient documentation

## 2018-06-15 DIAGNOSIS — M25511 Pain in right shoulder: Secondary | ICD-10-CM | POA: Insufficient documentation

## 2018-06-15 DIAGNOSIS — M546 Pain in thoracic spine: Secondary | ICD-10-CM | POA: Insufficient documentation

## 2018-06-15 DIAGNOSIS — I10 Essential (primary) hypertension: Secondary | ICD-10-CM | POA: Insufficient documentation

## 2018-06-15 DIAGNOSIS — F17210 Nicotine dependence, cigarettes, uncomplicated: Secondary | ICD-10-CM | POA: Insufficient documentation

## 2018-06-15 MED ORDER — LIDOCAINE 5 % EX PTCH
1.0000 | MEDICATED_PATCH | CUTANEOUS | Status: DC
  Administered 2018-06-15: 1 via TRANSDERMAL
  Filled 2018-06-15: qty 1

## 2018-06-15 MED ORDER — HYDROCODONE-ACETAMINOPHEN 5-325 MG PO TABS: 1.0000 | ORAL_TABLET | Freq: Three times a day (TID) | ORAL | 0 refills | Status: DC | PRN

## 2018-06-15 NOTE — ED Provider Notes (Signed)
[facility]-EMERGENCY DEPT Provider Note   CSN: [id number] Arrival date & time: 06/15/18  1756    History   Chief Complaint Chief Complaint  Patient presents with  . Shoulder Pain    HPI Joe Hendricks is a 53 y.o. male.     The history is provided by the patient and medical records. No language interpreter was used.  Shoulder Pain   Joe Hendricks is a 53 y.o. male who presents to the Emergency Department complaining of shoulder pain. He presents to the emergency department complaining of two days of right posterior shoulder pain that began while at rest. Pain is located in the posterior right shoulder and is worse with movement and external rotation of the shoulder and arm. Pain is present at rest as well. It is described as a sharp sensation that at times radiates down to his elbow. He denies any fevers, chills, shortness of breath, nausea, vomiting, abdominal pain, numbness, weakness. No change with exercise, aside from our movement. He has a history of hypertension and hyperlipidemia. He went to urgent care yesterday and was started on naproxen and Flexeril with no significant improvement in his symptoms. 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 He is right-hand dominant. No prior similar symptoms. Past Medical History:  Diagnosis Date  . Allergic rhinitis   . Colon polyps   . Dizziness   . Family history of heart disease   . GERD (gastroesophageal reflux disease)   . Hypertension   . Nasal congestion   . Watery eyes     Patient Active Problem List   Diagnosis Date Noted  . Essential hypertension 10/25/2017  . GERD (gastroesophageal reflux disease) 10/09/2017  . Colon polyps 10/09/2017  . Allergic rhinitis 10/09/2017  . Elevated blood pressure reading 10/09/2017    Past Surgical History:  Procedure Laterality Date  . NO PAST SURGERIES          Home Medications    Prior to Admission medications    Medication Sig Start Date End Date Taking? Authorizing Provider  amLODipine (NORVASC) 5 MG tablet Take 1 tablet (5 mg total) by mouth daily. 10/24/17   Fulp, Cammie, MD  aspirin EC 81 MG tablet Take 81 mg by mouth daily.    [provider]  Cetirizine HCl (ZYRTEC ALLERGY) 10 MG CAPS Take 1 capsule (10 mg total) by mouth daily. For relief of allergy symptoms 10/09/17   Fulp, Cammie, MD  cyclobenzaprine (FLEXERIL) 10 MG tablet Take 1 tablet by mouth before bed as needed for muscle spasm. Warning: May cause drowsiness. 06/11/18   Mardella LaymanHagler, Brian, MD  esomeprazole (NEXIUM) 20 MG capsule Take 1 capsule (20 mg total) by mouth daily. To reduce stomach acid 10/09/17   Fulp, Cammie, MD  Garlic 1000 MG CAPS Take 1,000 mg by mouth daily.    [provider]  HYDROcodone-acetaminophen (NORCO/VICODIN) 5-325 MG tablet Take 1 tablet by mouth every 8 (eight) hours as needed. 06/15/18   Tilden Fossaees, Yossef Gilkison, MD  Multiple Vitamins-Minerals (AIRBORNE GUMMIES) CHEW Chew 1 each by mouth daily.    [provider]  naproxen (NAPROSYN) 500 MG tablet Take 1 tablet (500 mg total) by mouth 2 (two) times daily. 06/11/18   Mardella LaymanHagler, Brian, MD  omega-3 acid ethyl esters (LOVAZA) 1 g capsule Take 2 capsules (2 g total) by mouth 2 (two) times daily. 10/24/17   Jake BatheSkains, Mark C, MD  rosuvastatin (CRESTOR) 20 MG tablet Take 1 tablet (  20 mg total) by mouth daily. 10/19/17 10/14/18  Jerline Pain, MD    Family History Family History  Problem Relation Age of Onset  . Cancer Mother        STOMACH..COLON POLYPS  . Heart attack Father   . Hypertension Sister   . Hypercalcemia Brother   . Hypertension Brother     Social History Social History   Tobacco Use  . Smoking status: Current Every Day Smoker    Packs/day: 0.25    Types: Cigarettes  . Smokeless tobacco: Never Used  Substance Use Topics  . Alcohol use: Yes  . Drug use: Not on file     Allergies   Patient has no known allergies.   Review of Systems  Review of Systems  All other systems reviewed and are negative.    Physical Exam Updated Vital Signs BP (!) 154/99 (BP Location: Right Arm)   Pulse (!) 111   Temp 98.1 F (36.7 C) (Oral)   Resp 16   Ht 5\' 9"  (1.753 m)   Wt 73.9 kg   SpO2 100%   BMI 24.07 kg/m   Physical Exam Vitals signs and nursing note reviewed.  Constitutional:      Appearance: He is well-developed.  HENT:     Head: Normocephalic and atraumatic.  Neck:     Musculoskeletal: Neck supple.  Cardiovascular:     Rate and Rhythm: Normal rate and regular rhythm.  Pulmonary:     Effort: Pulmonary effort is normal. No respiratory distress.  Abdominal:     Palpations: Abdomen is soft.     Tenderness: There is no abdominal tenderness. There is no guarding or rebound.  Musculoskeletal:     Comments: 2+ radial pulses bilaterally. There is tenderness to palpation over the mid thoracic back and the right scapular region. There are no bony stop office or deformities. Pain is also reproducible on external rotation of the right upper extremity. Range of motion is intact throughout the right upper extremity.  Skin:    General: Skin is warm and dry.  Neurological:     Mental Status: He is alert and oriented to person, place, and time.     Comments: 5/5 strength in BUE with sensation to light touch intact in BUE.    Psychiatric:        Behavior: Behavior normal.      ED Treatments / Results  Labs (all labs ordered are listed, but only abnormal results are displayed) Labs Reviewed - No data to display  EKG None  Radiology Dg Chest 2 View  Result Date: 06/15/2018 CLINICAL DATA:  Thoracic spine and right arm pain. EXAM: CHEST - 2 VIEW COMPARISON:  09/22/2007 FINDINGS: Midline trachea. Normal heart size and mediastinal contours. No pleural effusion or pneumothorax. Clear lungs. IMPRESSION: Normal chest. Electronically Signed   By: Abigail Miyamoto M.D.   On: 06/15/2018 19:05   Dg Thoracic Spine 2 View  Result Date:  06/15/2018 CLINICAL DATA:  Thoracic spine pain radiating to right arm. EXAM: THORACIC SPINE 2 VIEWS COMPARISON:  Chest x-ray earlier today as well as rib films 09/22/2007 FINDINGS: Vertebral body alignment and heights are normal. Pedicles are intact. No evidence of compression fracture or subluxation. IMPRESSION: No acute findings. Electronically Signed   By: Marin Olp M.D.   On: 06/15/2018 19:06    Procedures Procedures (including critical care time)  Medications Ordered in ED Medications  lidocaine (LIDODERM) 5 % 1 patch (1 patch Transdermal Patch Applied 06/15/18 1939)  Initial Impression / Assessment and Plan / ED Course  I have reviewed the triage vital signs and the nursing notes.  Pertinent labs & imaging results that were available during my care of the patient were reviewed by me and considered in my medical decision making (see chart for details).        Patient here for evaluation of right thoracic back pain/shoulder pain. He is non-toxic appearing on evaluation and neurologically intact. He is able to range his shoulder without difficulty evidence of shingles. Presentation is not consistent with dissection, epidural abscess, ACS, PE septic arthritis. Discussed with patient home care for thoracic back pain. Discussed outpatient follow-up and return precautions.  Final Clinical Impressions(s) / ED Diagnoses   Final diagnoses:  Acute right-sided thoracic back pain    ED Discharge Orders         Ordered    HYDROcodone-acetaminophen (NORCO/VICODIN) 5-325 MG tablet  Every 8 hours PRN     06/15/18 1951           Tilden Fossaees, Olevia Westervelt, MD 06/15/18 1953

## 2018-06-15 NOTE — ED Triage Notes (Signed)
Pt was seen on 6/2 for same. C/o right trap pain. Pt states no relief with cyclobenzaprine and naproxen as rx. Pt states painful to lift arm

## 2018-06-15 NOTE — Discharge Instructions (Addendum)
You can use salonpas patches or lidoderm cream, available over the counter according to label instructions as needed for pain.  Get rechecked if you have any new or concerning symptoms.

## 2018-09-17 ENCOUNTER — Ambulatory Visit: Payer: Self-pay | Attending: Nurse Practitioner | Admitting: Nurse Practitioner

## 2018-09-17 ENCOUNTER — Other Ambulatory Visit: Payer: Self-pay

## 2018-09-17 ENCOUNTER — Encounter: Payer: Self-pay | Admitting: Nurse Practitioner

## 2018-09-17 DIAGNOSIS — M25421 Effusion, right elbow: Secondary | ICD-10-CM

## 2018-09-17 DIAGNOSIS — M791 Myalgia, unspecified site: Secondary | ICD-10-CM

## 2018-09-17 DIAGNOSIS — J309 Allergic rhinitis, unspecified: Secondary | ICD-10-CM

## 2018-09-17 MED ORDER — CYCLOBENZAPRINE HCL 10 MG PO TABS: ORAL_TABLET | ORAL | 1 refills | Status: DC

## 2018-09-17 MED ORDER — FLUTICASONE PROPIONATE 50 MCG/ACT NA SUSP: 2.0000 | Freq: Every day | NASAL | 6 refills | Status: AC

## 2018-09-17 MED ORDER — PREDNISONE 5 MG (21) PO TBPK
ORAL_TABLET | ORAL | 0 refills | Status: DC
Start: 2018-09-17 — End: 2019-05-28

## 2018-09-17 MED ORDER — ZYRTEC ALLERGY 10 MG PO CAPS: 10.0000 mg | ORAL_CAPSULE | Freq: Every day | ORAL | 2 refills | Status: AC

## 2018-09-17 NOTE — Progress Notes (Signed)
Virtual Visit via Telephone Note Due to national recommendations of social distancing due to COVID 19, telehealth visit is felt to be most appropriate for this patient at this time.  I discussed the limitations, risks, security and privacy concerns of performing an evaluation and management service by telephone and the availability of in person appointments. I also discussed with the patient that there may be a patient responsible charge related to this service. The patient expressed understanding and agreed to proceed.    I connected with Joe Hendricks on 09/17/18  at  11:10 AM EDT  EDT by telephone and verified that I am speaking with the correct person using two identifiers.   Consent I discussed the limitations, risks, security and privacy concerns of performing an evaluation and management service by telephone and the availability of in person appointments. I also discussed with the patient that there may be a patient responsible charge related to this service. The patient expressed understanding and agreed to proceed.   Location of Patient: Private Residence   [facility]    Persons participating in Telemedicine visit: Bertram DenverZelda Jonell Brumbaugh FNP-BC YY OakridgeBien CMA Thereasa Distanceodney P Breck    History of Present Illness: Telemedicine visit for: Allergic rhinitis and right arm pain  Joint/Muscle Pain Patient complains of arthralgias and myalgias for which has been present for several months. Pain is located in the bilateral arms (biceps, forearms, shoulders), is described as aching and tight band, and is intermittent and mild. He also has complaints of right elbow swelling for several months. States swelling in the same elbow occurred years ago and he had to receive a cortisone injection. Can not recall what the diagnosis was at at that time but states current swelling is similar to past. Inciting event: Was installing a toploading washer which almost  fell over on his partner and he reached to catch the washer to prevent injury. Felt pulling in his arms and right elbow. Symptoms have been persistent since inciting event. The patient has taken naproxen prn with no relief of elbow swelling.  Allergic Rhinitis:  He has a history of seasonal allergies and has not been taking zyrtec as prescribed. Patient's symptoms include clear rhinorrhea, nasal congestion, postnasal drip, pressure sensation in ears and watery eyes. Will refill zyrtec and flonase.    Past Medical History:  Diagnosis Date  . Allergic rhinitis   . Colon polyps   . Dizziness   . Family history of heart disease   . GERD (gastroesophageal reflux disease)   . Hypertension   . Nasal congestion   . Watery eyes     Past Surgical History:  Procedure Laterality Date  . NO PAST SURGERIES      Family History  Problem Relation Age of Onset  . Cancer Mother        STOMACH..COLON POLYPS  . Heart attack Father   . Hypertension Sister   . Hypercalcemia Brother   . Hypertension Brother     Social History   Socioeconomic History  . Marital status: Married    Spouse name: Not on file  . Number of children: Not on file  . Years of education: Not on file  . Highest education level: Not on file  Occupational History  . Not on file  Social Needs  . Financial resource strain: Not on file  . Food insecurity    Worry: Not on file    Inability: Not on file  . Transportation needs  Medical: Not on file    Non-medical: Not on file  Tobacco Use  . Smoking status: Current Every Day Smoker    Packs/day: 0.25    Types: Cigarettes  . Smokeless tobacco: Never Used  Substance and Sexual Activity  . Alcohol use: Yes  . Drug use: Not on file  . Sexual activity: Yes  Lifestyle  . Physical activity    Days per week: Not on file    Minutes per session: Not on file  . Stress: Not on file  Relationships  . Social Herbalist on phone: Not on file    Gets together: Not  on file    Attends religious service: Not on file    Active member of club or organization: Not on file    Attends meetings of clubs or organizations: Not on file    Relationship status: Not on file  Other Topics Concern  . Not on file  Social History Narrative  . Not on file     Observations/Objective: Awake, alert and oriented x 3   Review of Systems  Constitutional: Negative for fever, malaise/fatigue and weight loss.  HENT: Positive for congestion and tinnitus. Negative for nosebleeds.        SEE HPI  Eyes: Negative.  Negative for blurred vision, double vision and photophobia.  Respiratory: Negative.  Negative for cough, shortness of breath and wheezing.   Cardiovascular: Negative.  Negative for chest pain, palpitations and leg swelling.  Gastrointestinal: Negative.  Negative for heartburn, nausea and vomiting.  Musculoskeletal: Positive for myalgias.       SEE HPI  Neurological: Negative.  Negative for dizziness, focal weakness, seizures and headaches.  Endo/Heme/Allergies: Positive for environmental allergies.  Psychiatric/Behavioral: Negative.  Negative for suicidal ideas.    Assessment and Plan: Diagnoses and all orders for this visit:  Swelling of right elbow -     predniSONE (STERAPRED UNI-PAK 21 TAB) 5 MG (21) TBPK tablet; Take 4 tablets a day with food for 4 days, 3 tabs a day for 3 days, 2 tabs a day for 2 days, 1 tab a day for 1 day -     DG Elbow Complete Right; Future  Allergic rhinitis, unspecified seasonality, unspecified trigger -     Cetirizine HCl (ZYRTEC ALLERGY) 10 MG CAPS; Take 1 capsule (10 mg total) by mouth daily. For relief of allergy symptoms -     fluticasone (FLONASE) 50 MCG/ACT nasal spray; Place 2 sprays into both nostrils daily.  Myalgia -     cyclobenzaprine (FLEXERIL) 10 MG tablet; Take 1 tablet by mouth before bed as needed for muscle spasm. Warning: May cause drowsiness.     Follow Up Instructions Return if symptoms worsen or fail to  improve.     I discussed the assessment and treatment plan with the patient. The patient was provided an opportunity to ask questions and all were answered. The patient agreed with the plan and demonstrated an understanding of the instructions.   The patient was advised to call back or seek an in-person evaluation if the symptoms worsen or if the condition fails to improve as anticipated.  I provided 22 minutes of non-face-to-face time during this encounter including median intraservice time, reviewing previous notes, labs, imaging, medications and explaining diagnosis and management.  Gildardo Pounds, FNP-BC

## 2018-09-23 ENCOUNTER — Telehealth: Payer: Self-pay

## 2018-09-23 NOTE — Telephone Encounter (Signed)
Pt contacted the office and is requesting an appointment for his MRI. Please f/u

## 2018-09-24 NOTE — Telephone Encounter (Signed)
CMA spoke to patient and inform he needs to get his Xray for is elbow at the [facility] Radiology department.  Pt. Understood and is aware he can get it done with no appt. Needed.

## 2018-09-25 ENCOUNTER — Other Ambulatory Visit: Payer: Self-pay

## 2018-09-25 ENCOUNTER — Ambulatory Visit (HOSPITAL_COMMUNITY)
Admission: RE | Admit: 2018-09-25 | Discharge: 2018-09-25 | Disposition: A | Discharge status: Home / Self Care | Payer: Managed Care, Other (non HMO) | Source: Ambulatory Visit | Attending: Nurse Practitioner | Admitting: Nurse Practitioner

## 2018-09-25 DIAGNOSIS — M25421 Effusion, right elbow: Secondary | ICD-10-CM

## 2018-09-28 ENCOUNTER — Other Ambulatory Visit: Payer: Self-pay | Admitting: Nurse Practitioner

## 2018-09-28 DIAGNOSIS — M7031 Other bursitis of elbow, right elbow: Secondary | ICD-10-CM

## 2018-10-03 ENCOUNTER — Encounter: Payer: Self-pay | Admitting: Orthopaedic Surgery

## 2018-10-03 ENCOUNTER — Other Ambulatory Visit: Payer: Self-pay

## 2018-10-03 ENCOUNTER — Ambulatory Visit (INDEPENDENT_AMBULATORY_CARE_PROVIDER_SITE_OTHER): Payer: Managed Care, Other (non HMO) | Admitting: Orthopaedic Surgery

## 2018-10-03 DIAGNOSIS — M19021 Primary osteoarthritis, right elbow: Secondary | ICD-10-CM | POA: Diagnosis not present

## 2018-10-03 NOTE — Progress Notes (Signed)
Office Visit Note   Patient: Joe Hendricks           Date of Birth: Nov 24, 1965           MRN: [id number] Visit Date: 10/03/2018              Requested by: Gildardo Pounds, NP [address] [place],  [place] [address] PCP: Antony Blackbird, MD   Assessment & Plan: Visit Diagnoses:  1. Primary osteoarthritis of right elbow     Plan: Impression is right elbow posttraumatic OA.  Patient is not interested in a cortisone injection.  Patient is reporting constant locking symptoms concerning for loose body in the joint.  Therefore we will obtain MRI to assess for this.  Follow-up after the MRI.  Follow-Up Instructions: Return in about 2 weeks (around 10/17/2018).   Orders:  Orders Placed This Encounter  Procedures  . MR Elbow Right w/o contrast   No orders of the defined types were placed in this encounter.     Procedures: No procedures performed   Clinical Data: No additional findings.   Subjective: Chief Complaint  Patient presents with  . Right Elbow - Pain    Joe Hendricks is a very pleasant 53 year old gentleman comes in for evaluation of right elbow pain and stiffness.  He states that he may have had a remote injury years ago he is complaining of of stiffness swelling with increased activity along with constant locking.  Denies any radicular symptoms or numbness and tingling.   Review of Systems  Constitutional: Negative.   All other systems reviewed and are negative.    Objective: Vital Signs: There were no vitals taken for this visit.  Physical Exam Vitals signs and nursing note reviewed.  Constitutional:      Appearance: He is well-developed.  HENT:     Head: Normocephalic and atraumatic.  Eyes:     Pupils: Pupils are equal, round, and reactive to light.  Neck:     Musculoskeletal: Neck supple.  Pulmonary:     Effort: Pulmonary effort is normal.  Abdominal:     Palpations: Abdomen is soft.  Musculoskeletal: Normal range of motion.  Skin:    General:  Skin is warm.  Neurological:     Mental Status: He is alert and oriented to person, place, and time.  Psychiatric:        Behavior: Behavior normal.        Thought Content: Thought content normal.        Judgment: Judgment normal.     Ortho Exam Right elbow exam shows 10 to 15 degree flexion contracture.    He has decent elbow flexion and extension strength.  He has no pain with pronation supination.  Collateral ligaments are stable.  Specialty Comments:  No specialty comments available.  Imaging: No results found.   PMFS History: Patient Active Problem List   Diagnosis Date Noted  . Primary osteoarthritis of right elbow 10/03/2018  . Essential hypertension 10/25/2017  . GERD (gastroesophageal reflux disease) 10/09/2017  . Colon polyps 10/09/2017  . Allergic rhinitis 10/09/2017  . Elevated blood pressure reading 10/09/2017   Past Medical History:  Diagnosis Date  . Allergic rhinitis   . Colon polyps   . Dizziness   . Family history of heart disease   . GERD (gastroesophageal reflux disease)   . Hypertension   . Nasal congestion   . Watery eyes     Family History  Problem Relation Age of Onset  .  Cancer Mother        STOMACH..COLON POLYPS  . Heart attack Father   . Hypertension Sister   . Hypercalcemia Brother   . Hypertension Brother     Past Surgical History:  Procedure Laterality Date  . NO PAST SURGERIES     Social History   Occupational History  . Not on file  Tobacco Use  . Smoking status: Current Every Day Smoker    Packs/day: 0.25    Types: Cigarettes  . Smokeless tobacco: Never Used  Substance and Sexual Activity  . Alcohol use: Yes  . Drug use: Not on file  . Sexual activity: Yes

## 2018-10-24 ENCOUNTER — Other Ambulatory Visit: Payer: Self-pay | Admitting: Family Medicine

## 2018-10-24 ENCOUNTER — Other Ambulatory Visit: Payer: Self-pay | Admitting: Cardiology

## 2018-10-24 DIAGNOSIS — K219 Gastro-esophageal reflux disease without esophagitis: Secondary | ICD-10-CM

## 2018-10-24 NOTE — Telephone Encounter (Signed)
UTR patient. Patient needs a follow up with PCP regarding GERD and HTN. A 30 day courtesy refill has been placed and patient needs to be seen prior to any additional refills.

## 2018-10-28 ENCOUNTER — Other Ambulatory Visit: Payer: Managed Care, Other (non HMO)

## 2018-10-30 ENCOUNTER — Ambulatory Visit: Payer: Managed Care, Other (non HMO) | Admitting: Orthopaedic Surgery

## 2018-11-11 ENCOUNTER — Ambulatory Visit
Admission: RE | Admit: 2018-11-11 | Discharge: 2018-11-11 | Disposition: A | Discharge status: Home / Self Care | Payer: Managed Care, Other (non HMO) | Source: Ambulatory Visit | Attending: Orthopaedic Surgery | Admitting: Orthopaedic Surgery

## 2018-11-11 ENCOUNTER — Other Ambulatory Visit: Payer: Self-pay

## 2018-11-11 DIAGNOSIS — M19021 Primary osteoarthritis, right elbow: Secondary | ICD-10-CM

## 2018-11-12 NOTE — Progress Notes (Signed)
Needs f/u appt 

## 2018-11-15 ENCOUNTER — Ambulatory Visit (INDEPENDENT_AMBULATORY_CARE_PROVIDER_SITE_OTHER): Payer: Managed Care, Other (non HMO) | Admitting: Orthopaedic Surgery

## 2018-11-15 ENCOUNTER — Other Ambulatory Visit: Payer: Self-pay

## 2018-11-15 DIAGNOSIS — M19021 Primary osteoarthritis, right elbow: Secondary | ICD-10-CM | POA: Diagnosis not present

## 2018-11-15 NOTE — Progress Notes (Signed)
   Office Visit Note   Patient: Joe Hendricks           Date of Birth: 1965-06-15           MRN: [id number] Visit Date: 11/15/2018              Requested by: Joe Blackbird, MD [place],  [place] [id number] PCP: Joe Blackbird, MD   Assessment & Plan: Visit Diagnoses:  1. Primary osteoarthritis of right elbow     Plan: MRI was reviewed in detail with the patient.  He does show DJD of the elbow joint.  He has osteophyte formation seen in the posterior lateral olecranon process.  I do not get a sense that the loose body in the coronoid fossa is symptomatic.  I would like to try cortisone injection in his elbow.  He will come back next Tuesday for this injection.  Follow-Up Instructions: Return if symptoms worsen or fail to improve.   Orders:  No orders of the defined types were placed in this encounter.  No orders of the defined types were placed in this encounter.     Procedures: No procedures performed   Clinical Data: No additional findings.   Subjective: Chief Complaint  Patient presents with  . Right Elbow - Follow-up    [name] comes back today for MRI review.  He complains of posterior lateral locking and symptoms.     Review of Systems   Objective: Vital Signs: There were no vitals taken for this visit.  Physical Exam  Ortho Exam Right elbow exam shows no joint effusion.  He lacks about 15 degrees of full extension.  He localizes his discomfort to the posterior lateral aspect elbow along the posterior lateral aspect of the olecranon. Specialty Comments:  No specialty comments available.  Imaging: No results found.   PMFS History: Patient Active Problem List   Diagnosis Date Noted  . Primary osteoarthritis of right elbow 10/03/2018  . Essential hypertension 10/25/2017  . GERD (gastroesophageal reflux disease) 10/09/2017  . Colon polyps 10/09/2017  . Allergic rhinitis 10/09/2017  . Elevated blood pressure reading 10/09/2017   Past  Medical History:  Diagnosis Date  . Allergic rhinitis   . Colon polyps   . Dizziness   . Family history of heart disease   . GERD (gastroesophageal reflux disease)   . Hypertension   . Nasal congestion   . Watery eyes     Family History  Problem Relation Age of Onset  . Cancer Mother        STOMACH..COLON POLYPS  . Heart attack Father   . Hypertension Sister   . Hypercalcemia Brother   . Hypertension Brother     Past Surgical History:  Procedure Laterality Date  . NO PAST SURGERIES     Social History   Occupational History  . Not on file  Tobacco Use  . Smoking status: Current Every Day Smoker    Packs/day: 0.25    Types: Cigarettes  . Smokeless tobacco: Never Used  Substance and Sexual Activity  . Alcohol use: Yes  . Drug use: Not on file  . Sexual activity: Yes

## 2018-11-19 ENCOUNTER — Other Ambulatory Visit: Payer: Self-pay

## 2018-11-19 ENCOUNTER — Ambulatory Visit (INDEPENDENT_AMBULATORY_CARE_PROVIDER_SITE_OTHER): Payer: Managed Care, Other (non HMO) | Admitting: Orthopaedic Surgery

## 2018-11-19 DIAGNOSIS — M19021 Primary osteoarthritis, right elbow: Secondary | ICD-10-CM

## 2018-11-19 MED ORDER — METHYLPREDNISOLONE ACETATE 40 MG/ML IJ SUSP
40.0000 mg | INTRAMUSCULAR | Status: AC | PRN
  Administered 2018-11-19: 16:00:00 40 mg via INTRA_ARTICULAR

## 2018-11-19 MED ORDER — LIDOCAINE HCL 1 % IJ SOLN
1.0000 mL | INTRAMUSCULAR | Status: AC | PRN
  Administered 2018-11-19: 1 mL

## 2018-11-19 MED ORDER — BUPIVACAINE HCL 0.5 % IJ SOLN
1.0000 mL | INTRAMUSCULAR | Status: AC | PRN
  Administered 2018-11-19: 1 mL via INTRA_ARTICULAR

## 2018-11-19 NOTE — Progress Notes (Signed)
   Office Visit Note   Patient: Joe Hendricks           Date of Birth: [birth year]           MRN: [id number] Visit Date: 11/19/2018              Requested by: Antony Blackbird, MD [place],  [place] [id number] PCP: Antony Blackbird, MD   Assessment & Plan: Visit Diagnoses:  1. Primary osteoarthritis of right elbow     Plan: right elbow injection performed today.  Tolerated well.  Follow up as needed.  Follow-Up Instructions: Return if symptoms worsen or fail to improve.   Orders:  No orders of the defined types were placed in this encounter.  No orders of the defined types were placed in this encounter.     Procedures: Medium Joint Inj: R elbow on 11/19/2018 4:18 PM Indications: pain Details: 25 G needle Medications: 1 mL lidocaine 1 %; 1 mL bupivacaine 0.5 %; 40 mg methylPREDNISolone acetate 40 MG/ML Outcome: tolerated well, no immediate complications Patient was prepped and draped in the usual sterile fashion.       Clinical Data: No additional findings.   Subjective: Chief Complaint  Patient presents with  . Right Elbow - Follow-up    [name] comes in today for right elbow injection.   Review of Systems   Objective: Vital Signs: There were no vitals taken for this visit.  Physical Exam  Ortho Exam  Specialty Comments:  No specialty comments available.  Imaging: No results found.   PMFS History: Patient Active Problem List   Diagnosis Date Noted  . Primary osteoarthritis of right elbow 10/03/2018  . Essential hypertension 10/25/2017  . GERD (gastroesophageal reflux disease) 10/09/2017  . Colon polyps 10/09/2017  . Allergic rhinitis 10/09/2017  . Elevated blood pressure reading 10/09/2017   Past Medical History:  Diagnosis Date  . Allergic rhinitis   . Colon polyps   . Dizziness   . Family history of heart disease   . GERD (gastroesophageal reflux disease)   . Hypertension   . Nasal congestion   . Watery eyes     Family  History  Problem Relation Age of Onset  . Cancer Mother        STOMACH..COLON POLYPS  . Heart attack Father   . Hypertension Sister   . Hypercalcemia Brother   . Hypertension Brother     Past Surgical History:  Procedure Laterality Date  . NO PAST SURGERIES     Social History   Occupational History  . Not on file  Tobacco Use  . Smoking status: Current Every Day Smoker    Packs/day: 0.25    Types: Cigarettes  . Smokeless tobacco: Never Used  Substance and Sexual Activity  . Alcohol use: Yes  . Drug use: Not on file  . Sexual activity: Yes

## 2018-11-23 ENCOUNTER — Other Ambulatory Visit: Payer: Self-pay | Admitting: Family Medicine

## 2018-11-23 ENCOUNTER — Other Ambulatory Visit: Payer: Self-pay | Admitting: Cardiology

## 2018-11-23 DIAGNOSIS — K219 Gastro-esophageal reflux disease without esophagitis: Secondary | ICD-10-CM

## 2018-12-23 ENCOUNTER — Other Ambulatory Visit: Payer: Self-pay | Admitting: Family Medicine

## 2018-12-23 DIAGNOSIS — K219 Gastro-esophageal reflux disease without esophagitis: Secondary | ICD-10-CM

## 2019-03-13 DIAGNOSIS — K219 Gastro-esophageal reflux disease without esophagitis: Secondary | ICD-10-CM | POA: Diagnosis not present

## 2019-03-13 DIAGNOSIS — K5730 Diverticulosis of large intestine without perforation or abscess without bleeding: Secondary | ICD-10-CM | POA: Diagnosis not present

## 2019-03-13 DIAGNOSIS — Z1211 Encounter for screening for malignant neoplasm of colon: Secondary | ICD-10-CM | POA: Diagnosis not present

## 2019-03-13 DIAGNOSIS — R748 Abnormal levels of other serum enzymes: Secondary | ICD-10-CM | POA: Diagnosis not present

## 2019-03-28 DIAGNOSIS — D125 Benign neoplasm of sigmoid colon: Secondary | ICD-10-CM | POA: Diagnosis not present

## 2019-03-28 DIAGNOSIS — Z1211 Encounter for screening for malignant neoplasm of colon: Secondary | ICD-10-CM | POA: Diagnosis not present

## 2019-03-28 DIAGNOSIS — Z800 Family history of malignant neoplasm of digestive organs: Secondary | ICD-10-CM | POA: Diagnosis not present

## 2019-03-28 DIAGNOSIS — D122 Benign neoplasm of ascending colon: Secondary | ICD-10-CM | POA: Diagnosis not present

## 2019-04-05 ENCOUNTER — Other Ambulatory Visit: Payer: Self-pay | Admitting: Family Medicine

## 2019-04-05 DIAGNOSIS — K219 Gastro-esophageal reflux disease without esophagitis: Secondary | ICD-10-CM

## 2019-04-10 ENCOUNTER — Ambulatory Visit: Payer: Managed Care, Other (non HMO) | Attending: Family Medicine | Admitting: Physician Assistant

## 2019-04-10 ENCOUNTER — Other Ambulatory Visit: Payer: Self-pay

## 2019-04-10 DIAGNOSIS — M791 Myalgia, unspecified site: Secondary | ICD-10-CM | POA: Diagnosis not present

## 2019-04-10 DIAGNOSIS — M62838 Other muscle spasm: Secondary | ICD-10-CM

## 2019-04-10 DIAGNOSIS — Z131 Encounter for screening for diabetes mellitus: Secondary | ICD-10-CM

## 2019-04-10 MED ORDER — NAPROXEN 500 MG PO TABS: 500.0000 mg | ORAL_TABLET | Freq: Two times a day (BID) | ORAL | 0 refills | Status: DC

## 2019-04-10 MED ORDER — CYCLOBENZAPRINE HCL 10 MG PO TABS: ORAL_TABLET | ORAL | 1 refills | Status: DC

## 2019-04-10 NOTE — Progress Notes (Signed)
Virtual Visit via Telephone Note  I connected with Joe Hendricks on 04/10/19 at  3:10 PM EDT by telephone and verified that I am speaking with the correct person using two identifiers.   I discussed the limitations, risks, security and privacy concerns of performing an evaluation and management service by telephone and the availability of in person appointments. I also discussed with the patient that there may be a patient responsible charge related to this service. The patient expressed understanding and agreed to proceed.  PATIENT visit by telephone virtually in the context of Covid-19 pandemic. Patient location:  home My Location:  [facility] office Persons on the call:  Me and the patient  History of Present Illness:  Patient gets pains in B shoulders and hands cramp when he tries to open up a bottle.  He had this about 1 year ago and it went away and started again about 1 month ago.  Comes and goes.  No CP.  No SOB.  NKI.  Not taking anything other than some OTC herbs for it but that isn't helping.      Observations/Objective:  NAD.  A&Ox3   Assessment and Plan: 1. Myalgia No labs since 2019 - naproxen (NAPROSYN) 500 MG tablet; Take 1 tablet (500 mg total) by mouth 2 (two) times daily with a meal.  Dispense: 60 tablet; Refill: 0 - cyclobenzaprine (FLEXERIL) 10 MG tablet; Take 1 tablet by mouth before bed as needed for muscle spasm. Warning: May cause drowsiness.  Dispense: 30 tablet; Refill: 1  2. Muscle spasm - Comprehensive metabolic panel; Future - CBC with Differential/Platelet; Future - cyclobenzaprine (FLEXERIL) 10 MG tablet; Take 1 tablet by mouth before bed as needed for muscle spasm. Warning: May cause drowsiness.  Dispense: 30 tablet; Refill: 1 - Vitamin D, 25-hydroxy; Future  3. Screening for diabetes mellitus I have had a lengthy discussion and provided education about insulin resistance and the intake of too much sugar/refined carbohydrates.  I have advised the patient to  work at a goal of eliminating sugary drinks, candy, desserts, sweets, refined sugars, processed foods, and white carbohydrates.  The patient expresses understanding.   - Hemoglobin A1c; Future    Follow Up Instructions: See PCP 2-3 months   I discussed the assessment and treatment plan with the patient. The patient was provided an opportunity to ask questions and all were answered. The patient agreed with the plan and demonstrated an understanding of the instructions.   The patient was advised to call back or seek an in-person evaluation if the symptoms worsen or if the condition fails to improve as anticipated.  I provided 9 minutes of non-face-to-face time during this encounter.   Georgian Co, PA-C  Patient ID: Joe Hendricks, male   DOB: [birth year], 54 y.o.   MRN: [id number]

## 2019-04-14 ENCOUNTER — Other Ambulatory Visit: Payer: Self-pay

## 2019-04-14 ENCOUNTER — Ambulatory Visit: Payer: Managed Care, Other (non HMO) | Attending: Family Medicine

## 2019-04-14 DIAGNOSIS — M62838 Other muscle spasm: Secondary | ICD-10-CM

## 2019-04-14 DIAGNOSIS — Z131 Encounter for screening for diabetes mellitus: Secondary | ICD-10-CM

## 2019-04-15 LAB — COMPREHENSIVE METABOLIC PANEL
ALT: 62 IU/L — ABNORMAL HIGH (ref 0–44)
AST: 58 IU/L — ABNORMAL HIGH (ref 0–40)
Albumin/Globulin Ratio: 1.5 (ref 1.2–2.2)
Albumin: 4.2 g/dL (ref 3.8–4.9)
Alkaline Phosphatase: 123 IU/L — ABNORMAL HIGH (ref 39–117)
BUN/Creatinine Ratio: 11 (ref 9–20)
BUN: 10 mg/dL (ref 6–24)
Bilirubin Total: 0.5 mg/dL (ref 0.0–1.2)
CO2: 23 mmol/L (ref 20–29)
Calcium: 9.3 mg/dL (ref 8.7–10.2)
Chloride: 101 mmol/L (ref 96–106)
Creatinine, Ser: 0.89 mg/dL (ref 0.76–1.27)
GFR calc Af Amer: 112 mL/min/{1.73_m2} (ref >59)
GFR calc non Af Amer: 97 mL/min/{1.73_m2} (ref >59)
Globulin, Total: 2.8 g/dL (ref 1.5–4.5)
Glucose: 102 mg/dL — ABNORMAL HIGH (ref 65–99)
Potassium: 3.6 mmol/L (ref 3.5–5.2)
Sodium: 142 mmol/L (ref 134–144)
Total Protein: 7 g/dL (ref 6.0–8.5)

## 2019-04-15 LAB — CBC WITH DIFFERENTIAL/PLATELET
Basophils Absolute: 0 10*3/uL (ref 0.0–0.2)
Basos: 0 %
EOS (ABSOLUTE): 0 10*3/uL (ref 0.0–0.4)
Eos: 0 %
Hematocrit: 38.3 % (ref 37.5–51.0)
Hemoglobin: 13.2 g/dL (ref 13.0–17.7)
Immature Grans (Abs): 0 10*3/uL (ref 0.0–0.1)
Immature Granulocytes: 0 %
Lymphocytes Absolute: 3.1 10*3/uL (ref 0.7–3.1)
Lymphs: 34 %
MCH: 34 pg — ABNORMAL HIGH (ref 26.6–33.0)
MCHC: 34.5 g/dL (ref 31.5–35.7)
MCV: 99 fL — ABNORMAL HIGH (ref 79–97)
Monocytes Absolute: 0.5 10*3/uL (ref 0.1–0.9)
Monocytes: 6 %
Neutrophils Absolute: 5.3 10*3/uL (ref 1.4–7.0)
Neutrophils: 60 %
Platelets: 293 10*3/uL (ref 150–450)
RBC: 3.88 x10E6/uL — ABNORMAL LOW (ref 4.14–5.80)
RDW: 11.8 % (ref 11.6–15.4)
WBC: 9 10*3/uL (ref 3.4–10.8)

## 2019-04-15 LAB — VITAMIN D 25 HYDROXY (VIT D DEFICIENCY, FRACTURES): Vit D, 25-Hydroxy: 30.1 ng/mL (ref 30.0–100.0)

## 2019-04-15 LAB — HEMOGLOBIN A1C
Est. average glucose Bld gHb Est-mCnc: 97 mg/dL
Hgb A1c MFr Bld: 5 % (ref 4.8–5.6)

## 2019-04-15 NOTE — Progress Notes (Signed)
Please call patient.  Vitamin D is normal but on the low end.  He should take 1000units vitamin D daily.  No diabetes. Blood count, kidney function and electrolytes are normal.  Liver function is a little elevated.  Avoid alcohol and any products containing tylenol.  Follow-up as planned.  Thanks, Georgian Co, PA-C

## 2019-05-05 ENCOUNTER — Other Ambulatory Visit: Payer: Self-pay | Admitting: Family Medicine

## 2019-05-28 ENCOUNTER — Ambulatory Visit: Payer: Managed Care, Other (non HMO) | Attending: Family Medicine | Admitting: Physician Assistant

## 2019-05-28 ENCOUNTER — Other Ambulatory Visit: Payer: Self-pay

## 2019-05-28 DIAGNOSIS — M25421 Effusion, right elbow: Secondary | ICD-10-CM

## 2019-05-28 DIAGNOSIS — M62838 Other muscle spasm: Secondary | ICD-10-CM | POA: Diagnosis not present

## 2019-05-28 DIAGNOSIS — I10 Essential (primary) hypertension: Secondary | ICD-10-CM | POA: Diagnosis not present

## 2019-05-28 MED ORDER — AMLODIPINE BESYLATE 5 MG PO TABS: ORAL_TABLET | ORAL | 0 refills | Status: DC

## 2019-05-28 MED ORDER — MELOXICAM 15 MG PO TABS: 15.0000 mg | ORAL_TABLET | Freq: Every day | ORAL | 1 refills | Status: AC

## 2019-05-28 NOTE — Progress Notes (Signed)
States that muscle relaxer's make him feel funny, states that he is still having pain in his arms.

## 2019-05-28 NOTE — Progress Notes (Signed)
Virtual Visit via Telephone Note  I connected with Joe Hendricks on 05/28/19 at  2:50 PM EDT by telephone and verified that I am speaking with the correct person using two identifiers.   I discussed the limitations, risks, security and privacy concerns of performing an evaluation and management service by telephone and the availability of in person appointments. I also discussed with the patient that there may be a patient responsible charge related to this service. The patient expressed understanding and agreed to proceed.  PATIENT visit by telephone virtually in the context of Covid-19 pandemic. Patient location:  home My Location:  [facility] office Persons on the call: me and the patient  History of Present Illness: He is continuing to have muscle spasms and pain in B shoulders, arms and even hands cramp at night sometimes.  He has not been able to take muscle relaxers.  Prednisone did not help.  No weakness or paresthesias.    Says BP is controlled OOO.    Labs 4/21-unremarkable  Observations/Objective:  NAD.     Assessment and Plan: 1. Muscle spasm Failed prednisone, muscle relaxers, and naprosyn - meloxicam (MOBIC) 15 MG tablet; Take 1 tablet (15 mg total) by mouth daily. Prn pain  Dispense: 30 tablet; Refill: 1 - Ambulatory referral to Orthopedic Surgery  2. Essential hypertension controlled - amLODipine (NORVASC) 5 MG tablet; TAKE 1 TABLET(5 MG) BY MOUTH DAILY  Dispense: 90 tablet; Refill: 0  3. Swelling of right elbow - meloxicam (MOBIC) 15 MG tablet; Take 1 tablet (15 mg total) by mouth daily. Prn pain  Dispense: 30 tablet; Refill: 1 - Ambulatory referral to Orthopedic Surgery   Follow Up Instructions: See PCP in 3 months;  Sooner if needed   I discussed the assessment and treatment plan with the patient. The patient was provided an opportunity to ask questions and all were answered. The patient agreed with the plan and demonstrated an understanding of the  instructions.   The patient was advised to call back or seek an in-person evaluation if the symptoms worsen or if the condition fails to improve as anticipated.  I provided 9 minutes of non-face-to-face time during this encounter.   Georgian Co, PA-C  Patient ID: Joe Hendricks, male   DOB: January 03, 1966, 54 y.o.   MRN: [id number]

## 2019-06-06 ENCOUNTER — Ambulatory Visit: Payer: Managed Care, Other (non HMO) | Admitting: Orthopaedic Surgery

## 2019-06-11 ENCOUNTER — Ambulatory Visit: Payer: BC Managed Care – PPO | Admitting: Orthopaedic Surgery

## 2019-06-11 ENCOUNTER — Other Ambulatory Visit: Payer: Self-pay

## 2019-06-11 DIAGNOSIS — M79601 Pain in right arm: Secondary | ICD-10-CM | POA: Diagnosis not present

## 2019-06-11 DIAGNOSIS — M79602 Pain in left arm: Secondary | ICD-10-CM | POA: Diagnosis not present

## 2019-06-11 MED ORDER — CELECOXIB 200 MG PO CAPS: 200.0000 mg | ORAL_CAPSULE | Freq: Two times a day (BID) | ORAL | 3 refills | Status: AC

## 2019-06-11 NOTE — Progress Notes (Signed)
Office Visit Note   Patient: Joe Hendricks           Date of Birth: [birth year]           MRN: [id number] Visit Date: 06/11/2019              Requested by: Anders Simmonds, PA-C [address] [place],  [place] [address] PCP: Cain Saupe, MD   Assessment & Plan: Visit Diagnoses:  1. Bilateral arm pain     Plan: Impression is bilateral arm pain likely due to overuse and fatigue.  I recommend relative rest and a course of Celebrex twice a day as well as ice and heat symptomatic treatment.  Patient instructed to follow-up in about a month if he does not notice any improvement.  Follow-Up Instructions: Return if symptoms worsen or fail to improve.   Orders:  No orders of the defined types were placed in this encounter.  Meds ordered this encounter  Medications  . celecoxib (CELEBREX) 200 MG capsule    Sig: Take 1 capsule (200 mg total) by mouth 2 (two) times daily.    Dispense:  30 capsule    Refill:  3      Procedures: No procedures performed   Clinical Data: No additional findings.   Subjective: Chief Complaint  Patient presents with  . 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  Denies any neck pain or numbness and tingling or burning or radicular symptoms.  Denies any injuries.  I feels like both arms are cramping with use.  He tried a muscle relaxer but made him too drowsy.   Review of Systems  Constitutional: Negative.   All other systems reviewed and are negative.    Objective: Vital Signs: There were no vitals taken for this visit.  Physical Exam Vitals and nursing note reviewed.  Constitutional:      Appearance: He is well-developed.  Pulmonary:     Effort: Pulmonary effort is normal.  Abdominal:     Palpations: Abdomen is soft.  Skin:    General: Skin is warm.  Neurological:     Mental Status: He is alert and oriented to person,  place, and time.  Psychiatric:        Behavior: Behavior normal.        Thought Content: Thought content normal.        Judgment: Judgment normal.     Ortho Exam Exam of the cervical spine and shoulder show full range of motion without pain or tenderness to palpation.  Manual muscle testing is normal.  His muscles are sore from palpation.  Neurovascular intact distally. Specialty Comments:  No specialty comments available.  Imaging: No results found.   PMFS History: Patient Active Problem List   Diagnosis Date Noted  . Bilateral arm pain 06/11/2019  . Primary osteoarthritis of right elbow 10/03/2018  . Essential hypertension 10/25/2017  . GERD (gastroesophageal reflux disease) 10/09/2017  . Colon polyps 10/09/2017  . Allergic rhinitis 10/09/2017  . Elevated blood pressure reading 10/09/2017   Past Medical History:  Diagnosis Date  . Allergic rhinitis   . Colon polyps   . Dizziness   . Family history of heart disease   . GERD (gastroesophageal reflux disease)   . Hypertension   . Nasal congestion   . Watery eyes     Family History  Problem Relation Age of  Onset  . Cancer Mother        STOMACH..COLON POLYPS  . Heart attack Father   . Hypertension Sister   . Hypercalcemia Brother   . Hypertension Brother     Past Surgical History:  Procedure Laterality Date  . NO PAST SURGERIES     Social History   Occupational History  . Not on file  Tobacco Use  . Smoking status: Current Every Day Smoker    Packs/day: 0.25    Types: Cigarettes  . Smokeless tobacco: Never Used  Substance and Sexual Activity  . Alcohol use: Yes  . Drug use: Not on file  . Sexual activity: Yes

## 2019-06-13 ENCOUNTER — Other Ambulatory Visit: Payer: Self-pay

## 2019-06-13 DIAGNOSIS — K219 Gastro-esophageal reflux disease without esophagitis: Secondary | ICD-10-CM

## 2019-06-13 MED ORDER — ESOMEPRAZOLE MAGNESIUM 20 MG PO CPDR: 20.0000 mg | DELAYED_RELEASE_CAPSULE | Freq: Every day | ORAL | 1 refills | Status: DC

## 2019-06-30 ENCOUNTER — Other Ambulatory Visit: Payer: Self-pay | Admitting: Physician Assistant

## 2019-06-30 DIAGNOSIS — K219 Gastro-esophageal reflux disease without esophagitis: Secondary | ICD-10-CM

## 2019-08-29 ENCOUNTER — Other Ambulatory Visit: Payer: Self-pay

## 2019-08-29 ENCOUNTER — Other Ambulatory Visit: Payer: BC Managed Care – PPO

## 2019-08-29 DIAGNOSIS — Z20822 Contact with and (suspected) exposure to covid-19: Secondary | ICD-10-CM | POA: Diagnosis not present

## 2019-08-30 LAB — SPECIMEN STATUS REPORT

## 2019-08-30 LAB — SARS-COV-2, NAA 2 DAY TAT

## 2019-08-30 LAB — NOVEL CORONAVIRUS, NAA: SARS-CoV-2, NAA: NOT DETECTED

## 2019-09-20 ENCOUNTER — Other Ambulatory Visit: Payer: Self-pay | Admitting: Physician Assistant

## 2019-09-20 DIAGNOSIS — M62838 Other muscle spasm: Secondary | ICD-10-CM

## 2019-09-20 DIAGNOSIS — M25421 Effusion, right elbow: Secondary | ICD-10-CM

## 2019-09-20 DIAGNOSIS — I10 Essential (primary) hypertension: Secondary | ICD-10-CM

## 2019-09-20 IMAGING — DX DG ELBOW COMPLETE 3+V*R*
4 series · 4 of 4 positions shown · non-contrast
Comparison: None.

CLINICAL DATA: Chronic swelling, evaluate for bursitis

EXAM:
RIGHT ELBOW - COMPLETE 3+ VIEW

[elbow ap]
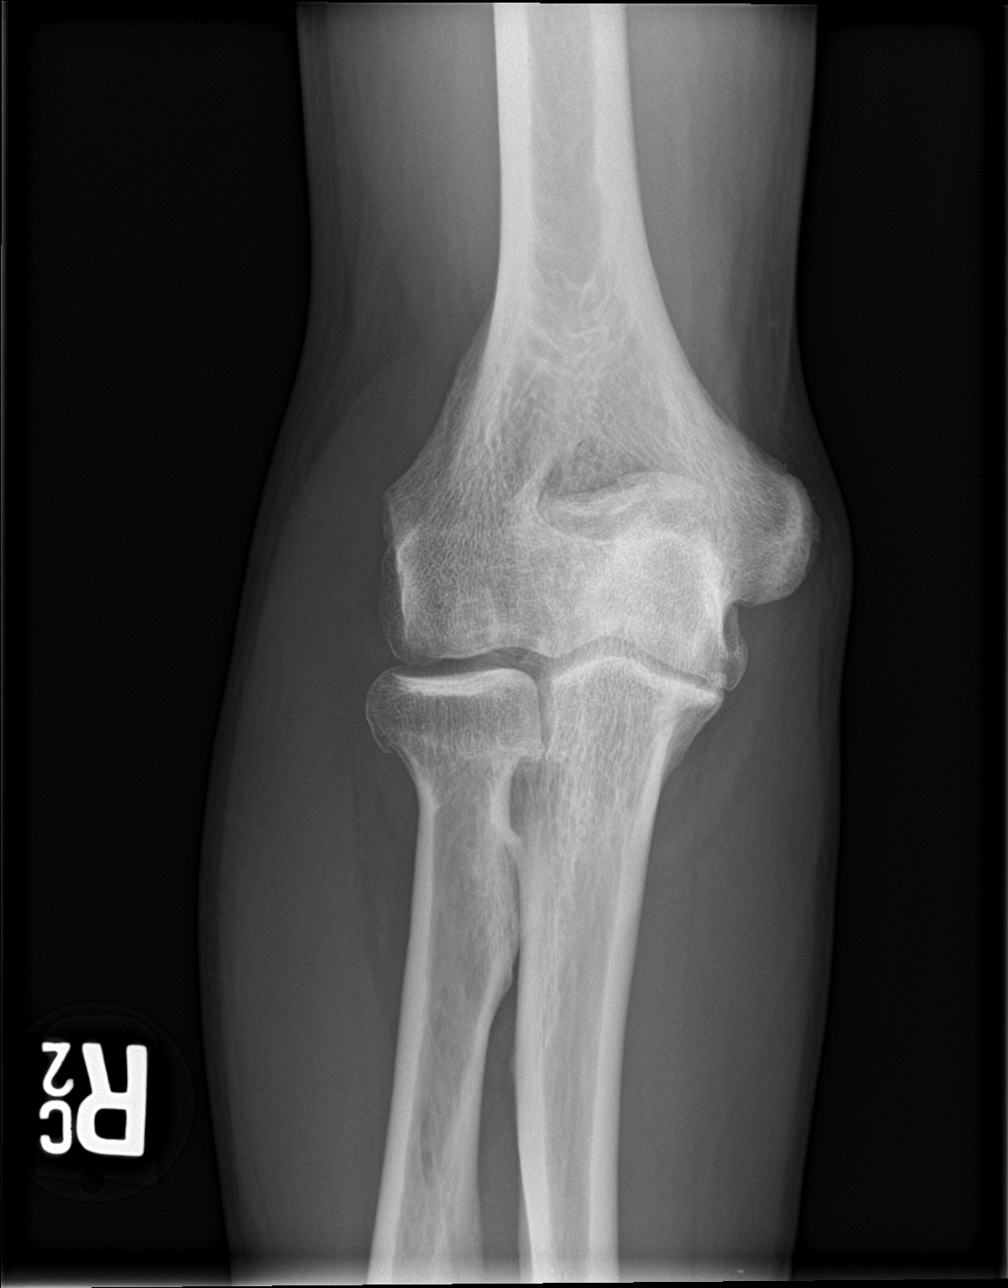

[elbow obl (1 of 2)]
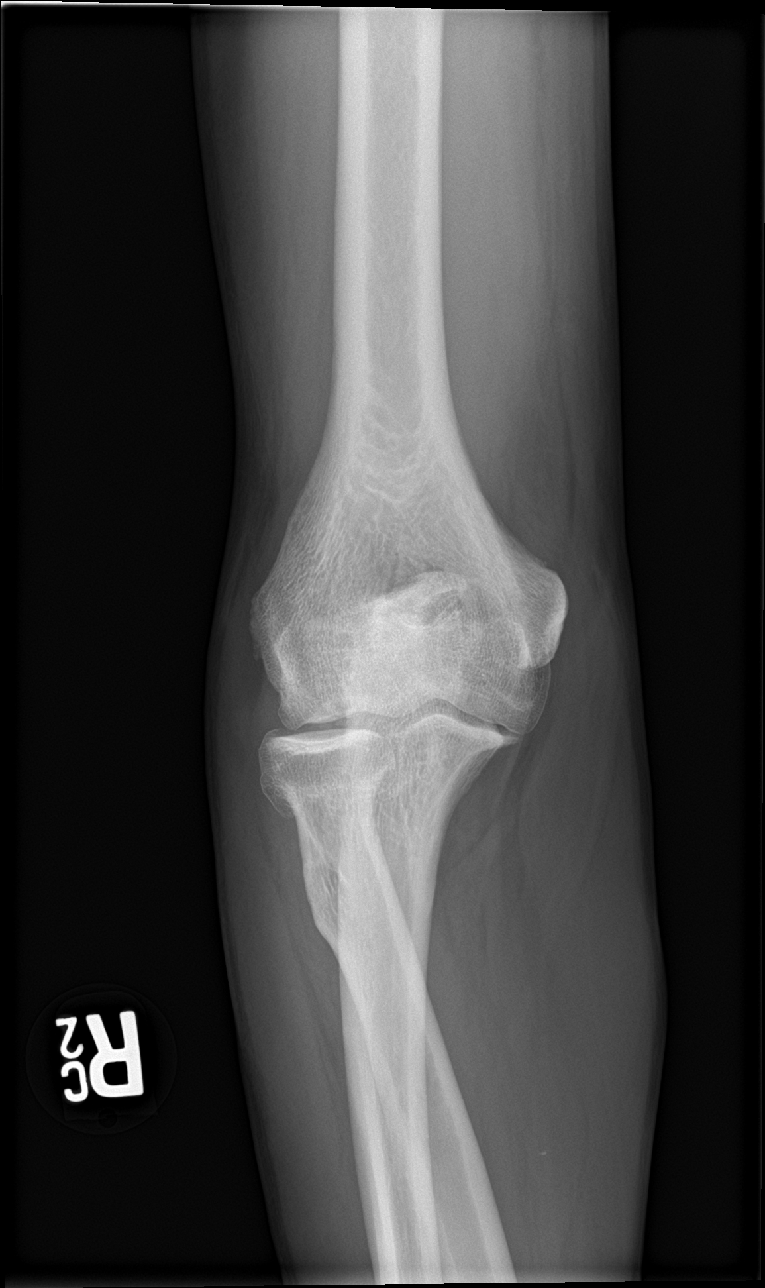

[elbow obl (2 of 2)]
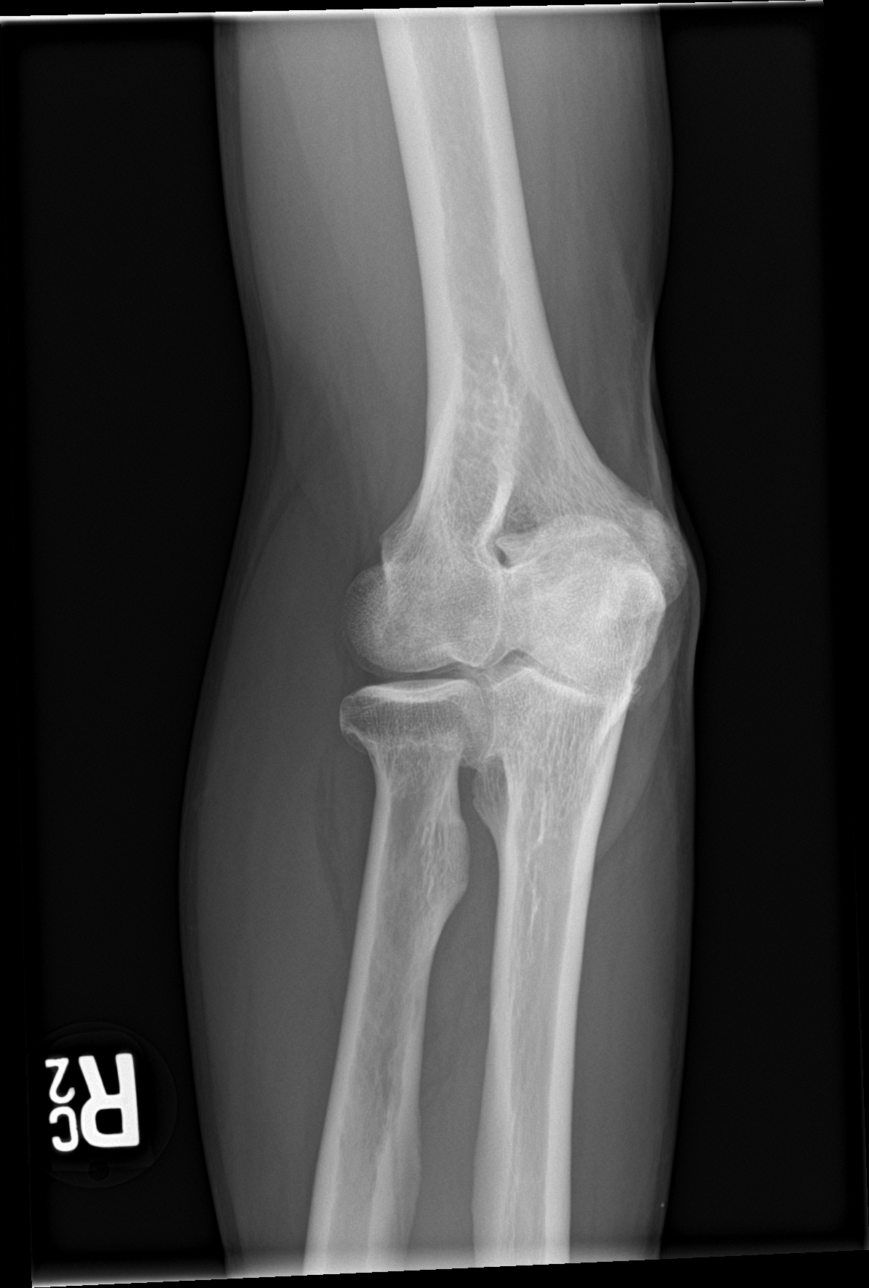

[elbow lat]
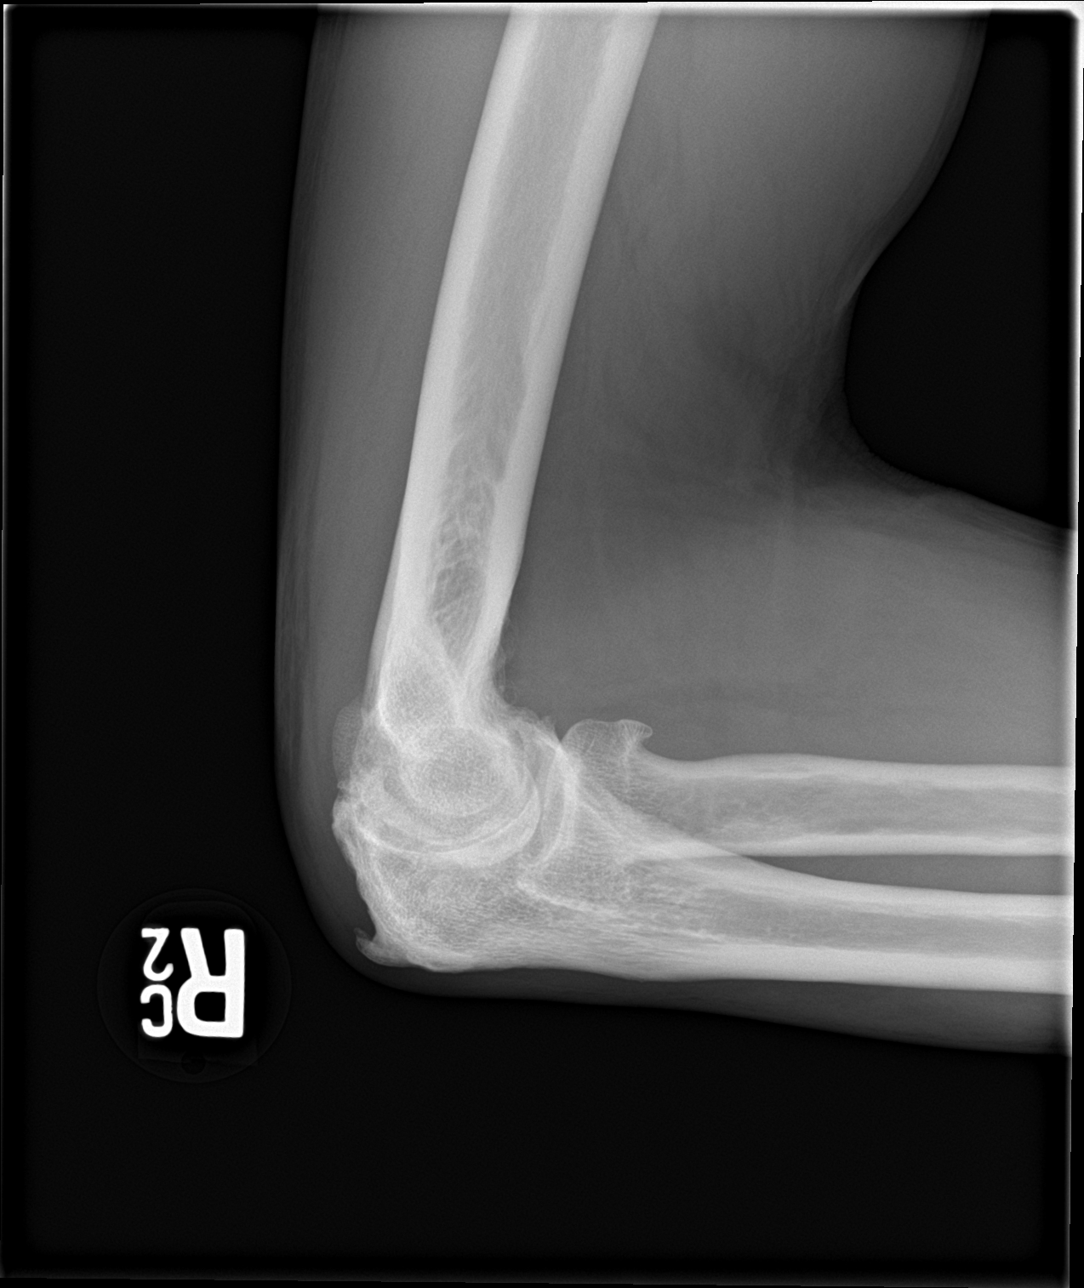

[4 of 4 positions shown; findings below may reference images not displayed]

FINDINGS: No fracture or dislocation of the right elbow. There is moderate
elbow joint arthrosis. No significant joint effusion. There is
enthesopathic change of the olecranon process. Soft tissue swelling
over the posterior olecranon.
IMPRESSION: No fracture or dislocation of the right elbow. There is moderate
elbow joint arthrosis. No significant joint effusion. There is
enthesopathic change of the olecranon process. Soft tissue swelling
over the posterior olecranon, potentially consistent with bursitis.

## 2019-09-20 NOTE — Telephone Encounter (Signed)
Requested  medications are  due for refill today yes  Requested medications are on the active medication list yes  Last refill 8/12  Last visit 05/2019  Future visit scheduled no  Notes to clinic I do not believe this med was to be continued, please assess.

## 2020-02-18 DIAGNOSIS — Z0000 Encounter for general adult medical examination without abnormal findings: Secondary | ICD-10-CM | POA: Diagnosis not present

## 2020-02-18 DIAGNOSIS — Z125 Encounter for screening for malignant neoplasm of prostate: Secondary | ICD-10-CM | POA: Diagnosis not present

## 2020-02-18 DIAGNOSIS — E7800 Pure hypercholesterolemia, unspecified: Secondary | ICD-10-CM | POA: Diagnosis not present

## 2020-02-18 DIAGNOSIS — R251 Tremor, unspecified: Secondary | ICD-10-CM | POA: Diagnosis not present

## 2020-04-09 DIAGNOSIS — E782 Mixed hyperlipidemia: Secondary | ICD-10-CM | POA: Diagnosis not present

## 2020-04-09 DIAGNOSIS — I10 Essential (primary) hypertension: Secondary | ICD-10-CM | POA: Diagnosis not present

## 2020-06-09 DIAGNOSIS — E782 Mixed hyperlipidemia: Secondary | ICD-10-CM | POA: Diagnosis not present

## 2020-06-14 DIAGNOSIS — Z23 Encounter for immunization: Secondary | ICD-10-CM | POA: Diagnosis not present

## 2020-08-16 DIAGNOSIS — Z23 Encounter for immunization: Secondary | ICD-10-CM | POA: Diagnosis not present

## 2020-09-15 DIAGNOSIS — R252 Cramp and spasm: Secondary | ICD-10-CM | POA: Diagnosis not present

## 2020-09-15 DIAGNOSIS — R7309 Other abnormal glucose: Secondary | ICD-10-CM | POA: Diagnosis not present

## 2020-11-18 DIAGNOSIS — I10 Essential (primary) hypertension: Secondary | ICD-10-CM | POA: Diagnosis not present

## 2020-11-18 DIAGNOSIS — M79641 Pain in right hand: Secondary | ICD-10-CM | POA: Diagnosis not present

## 2020-11-18 DIAGNOSIS — R748 Abnormal levels of other serum enzymes: Secondary | ICD-10-CM | POA: Diagnosis not present

## 2020-11-23 ENCOUNTER — Encounter: Payer: Self-pay | Admitting: Orthopaedic Surgery

## 2020-11-23 ENCOUNTER — Ambulatory Visit: Payer: Self-pay

## 2020-11-23 ENCOUNTER — Other Ambulatory Visit: Payer: Self-pay

## 2020-11-23 ENCOUNTER — Ambulatory Visit (INDEPENDENT_AMBULATORY_CARE_PROVIDER_SITE_OTHER): Payer: BC Managed Care – PPO | Admitting: Orthopaedic Surgery

## 2020-11-23 DIAGNOSIS — M1811 Unilateral primary osteoarthritis of first carpometacarpal joint, right hand: Secondary | ICD-10-CM | POA: Diagnosis not present

## 2020-11-23 DIAGNOSIS — M79641 Pain in right hand: Secondary | ICD-10-CM | POA: Diagnosis not present

## 2020-11-23 MED ORDER — LIDOCAINE HCL 1 % IJ SOLN
3.0000 mL | INTRAMUSCULAR | Status: AC | PRN
Start: 2020-11-23 — End: 2020-11-23
  Administered 2020-11-23: 3 mL

## 2020-11-23 MED ORDER — METHYLPREDNISOLONE ACETATE 40 MG/ML IJ SUSP
13.3300 mg | INTRAMUSCULAR | Status: AC | PRN
  Administered 2020-11-23: 13.33 mg

## 2020-11-23 MED ORDER — BUPIVACAINE HCL 0.25 % IJ SOLN
0.3300 mL | INTRAMUSCULAR | Status: AC | PRN
  Administered 2020-11-23: .33 mL

## 2020-11-23 NOTE — Progress Notes (Signed)
Office Visit Note   Patient: Joe Hendricks           Date of Birth: [birth year]           MRN: [id number] Visit Date: 11/23/2020              Requested by: No referring provider defined for this encounter. PCP: Cain Saupe, MD (Inactive)   Assessment & Plan: Visit Diagnoses:  1. Arthritis of carpometacarpal (CMC) joint of right thumb     Plan: Impression is right thumb first CMC joint osteoarthritis.  Today, we discussed injecting this with cortisone for which she would like to proceed.  I am unsure what is causing the cramping and am cautiously optimistic of the cortisone injection helping with that.  He will follow-up with [name] should his symptoms not improve over the next several weeks.  Call with concerns or questions in the meantime.  Follow-Up Instructions: Return if symptoms worsen or fail to improve.   Orders:  Orders Placed This Encounter  Procedures   Hand/UE Inj: R thumb CMC   XR Hand Complete Right   No orders of the defined types were placed in this encounter.     Procedures: Hand/UE Inj: R thumb CMC for osteoarthritis on 11/23/2020 4:18 PM Medications: 3 mL lidocaine 1 %; 0.33 mL bupivacaine 0.25 %; 13.33 mg methylPREDNISolone acetate 40 MG/ML     Clinical Data: No additional findings.   Subjective: Chief Complaint  Patient presents with   Right Hand - Follow-up    HPI patient is a pleasant 55 year old right-hand-dominant gentleman who comes in today with right thumb pain for the past few months.  He denies any injury or change in activity.  The pain is primarily to the first [facility] joint.  He has associated cramping.  Pain is worse when opening bottle caps.  He has not taken any medication for pain.  Review of Systems as detailed in HPI.  All others reviewed are negative.   Objective: Vital Signs: There were no vitals taken for this visit.  Physical Exam well-developed well-nourished gentleman in no acute distress.  Alert and oriented x3.  Ortho  Exam right thumb exam shows moderate tenderness to the first CMC joint.  Minimal pain but no crepitus with grind test.  He is neurovascular intact distally.   Specialty Comments:  No specialty comments available.  Imaging: XR Hand Complete Right  Result Date: 11/23/2020 X-rays demonstrate moderate degenerative changes of the first [facility] joint    PMFS History: Patient Active Problem List   Diagnosis Date Noted   Bilateral arm pain 06/11/2019   Primary osteoarthritis of right elbow 10/03/2018   Essential hypertension 10/25/2017   GERD (gastroesophageal reflux disease) 10/09/2017   Colon polyps 10/09/2017   Allergic rhinitis 10/09/2017   Elevated blood pressure reading 10/09/2017   Past Medical History:  Diagnosis Date   Allergic rhinitis    Colon polyps    Dizziness    Family history of heart disease    GERD (gastroesophageal reflux disease)    Hypertension    Nasal congestion    Watery eyes     Family History  Problem Relation Age of Onset   Cancer Mother        STOMACH..COLON POLYPS   Heart attack Father    Hypertension Sister    Hypercalcemia Brother    Hypertension Brother     Past Surgical History:  Procedure Laterality Date   NO PAST SURGERIES     Social History  Occupational History   Not on file  Tobacco Use   Smoking status: Every Day    Packs/day: 0.25    Types: Cigarettes   Smokeless tobacco: Never  Substance and Sexual Activity   Alcohol use: Yes   Drug use: Not on file   Sexual activity: Yes

## 2020-12-01 ENCOUNTER — Other Ambulatory Visit: Payer: Self-pay | Admitting: Family Medicine

## 2020-12-01 DIAGNOSIS — R748 Abnormal levels of other serum enzymes: Secondary | ICD-10-CM

## 2021-03-02 DIAGNOSIS — M791 Myalgia, unspecified site: Secondary | ICD-10-CM | POA: Diagnosis not present

## 2021-03-02 DIAGNOSIS — E782 Mixed hyperlipidemia: Secondary | ICD-10-CM | POA: Diagnosis not present

## 2021-03-02 DIAGNOSIS — I10 Essential (primary) hypertension: Secondary | ICD-10-CM | POA: Diagnosis not present

## 2021-03-02 DIAGNOSIS — Z125 Encounter for screening for malignant neoplasm of prostate: Secondary | ICD-10-CM | POA: Diagnosis not present

## 2021-03-02 DIAGNOSIS — R748 Abnormal levels of other serum enzymes: Secondary | ICD-10-CM | POA: Diagnosis not present

## 2021-03-02 DIAGNOSIS — Z0000 Encounter for general adult medical examination without abnormal findings: Secondary | ICD-10-CM | POA: Diagnosis not present

## 2021-03-16 ENCOUNTER — Ambulatory Visit
Admission: RE | Admit: 2021-03-16 | Discharge: 2021-03-16 | Disposition: A | Discharge status: Home / Self Care | Payer: BC Managed Care – PPO | Source: Ambulatory Visit | Attending: Family Medicine | Admitting: Family Medicine

## 2021-03-16 DIAGNOSIS — R748 Abnormal levels of other serum enzymes: Secondary | ICD-10-CM

## 2022-04-15 ENCOUNTER — Ambulatory Visit (HOSPITAL_COMMUNITY)
Admission: EM | Admit: 2022-04-15 | Discharge: 2022-04-15 | Disposition: A | Discharge status: Home / Self Care | Payer: BC Managed Care – PPO

## 2022-04-15 ENCOUNTER — Encounter (HOSPITAL_COMMUNITY): Payer: Self-pay | Admitting: *Deleted

## 2022-04-15 DIAGNOSIS — M109 Gout, unspecified: Secondary | ICD-10-CM

## 2022-04-15 DIAGNOSIS — M79671 Pain in right foot: Secondary | ICD-10-CM | POA: Insufficient documentation

## 2022-04-15 LAB — URIC ACID: Uric Acid, Serum: 7.2 mg/dL (ref 3.7–8.6)

## 2022-04-15 MED ORDER — PREDNISONE 10 MG PO TABS: 10.0000 mg | ORAL_TABLET | Freq: Three times a day (TID) | ORAL | 0 refills | Status: AC

## 2022-04-15 MED ORDER — NAPROXEN 375 MG PO TABS: 375.0000 mg | ORAL_TABLET | Freq: Two times a day (BID) | ORAL | 0 refills | Status: AC

## 2022-04-15 NOTE — ED Provider Notes (Signed)
[facility]    CSN: [id number] Arrival date & time: 04/15/22  1211      History   Chief Complaint Chief Complaint  Patient presents with   Foot Pain    HPI Joe Hendricks is a 57 y.o. male.   57 year old male presents with right big toe pain.  Patient indicates that he woke up yesterday with pain on his right big toe.  He indicates that he has had redness and swelling at the site of the pain and discomfort.  Patient indicates that he has not been bitten by any insects, trauma, or stubbed his toe.  Patient indicates that he is does not have a history of having gout however he relates that his father did have gout on a regular basis.  Patient indicates that he is not eating foods recently that are high in purine.  Patient indicates he has used Epsom salt soaks that has helped relieve his symptoms, he has not taken any OTC medicines for pain.  He is without fever or chills.   Foot Pain    Past Medical History:  Diagnosis Date   Allergic rhinitis    Colon polyps    Dizziness    Family history of heart disease    GERD (gastroesophageal reflux disease)    Hypertension    Nasal congestion    Watery eyes     Patient Active Problem List   Diagnosis Date Noted   Bilateral arm pain 06/11/2019   Primary osteoarthritis of right elbow 10/03/2018   Essential hypertension 10/25/2017   GERD (gastroesophageal reflux disease) 10/09/2017   Colon polyps 10/09/2017   Allergic rhinitis 10/09/2017   Elevated blood pressure reading 10/09/2017    Past Surgical History:  Procedure Laterality Date   NO PAST SURGERIES         Home Medications    Prior to Admission medications   Medication Sig Start Date End Date Taking? Authorizing Provider  amLODipine (NORVASC) 5 MG tablet TAKE 1 TABLET(5 MG) BY MOUTH DAILY 09/20/19  Yes Fulp, Cammie, MD  naproxen (NAPROSYN) 375 MG tablet Take 1 tablet (375 mg total) by mouth 2 (two) times daily. 04/15/22  Yes Ellsworth Lennox, PA-C  Omega-3  Fatty Acids (FISH OIL PO) Take by mouth.   Yes [provider]  predniSONE (DELTASONE) 10 MG tablet Take 1 tablet (10 mg total) by mouth in the morning, at noon, and at bedtime. 04/15/22  Yes Ellsworth Lennox, PA-C  Pyridoxine HCl (VITAMIN B6 PO) Take by mouth.   Yes [provider]  rosuvastatin (CRESTOR) 20 MG tablet Take 1 tablet (20 mg total) by mouth daily. Please make overdue appt with Dr. Anne Fu before anymore refills. 2nd attempt 11/25/18  Yes Jake Bathe, MD  VITAMIN D PO Take by mouth.   Yes [provider]  aspirin EC 81 MG tablet Take 81 mg by mouth daily.    [provider]  celecoxib (CELEBREX) 200 MG capsule Take 1 capsule (200 mg total) by mouth 2 (two) times daily. 06/11/19   Tarry Kos, MD  Cetirizine HCl (ZYRTEC ALLERGY) 10 MG CAPS Take 1 capsule (10 mg total) by mouth daily. For relief of allergy symptoms 09/17/18   Claiborne Rigg, NP  esomeprazole (NEXIUM) 20 MG capsule TAKE 1 CAPSULE(20 MG) BY MOUTH DAILY 07/02/19   Fulp, Cammie, MD  fluticasone (FLONASE) 50 MCG/ACT nasal spray Place 2 sprays into both nostrils daily. 09/17/18   Claiborne Rigg, NP  Garlic 1000 MG  CAPS Take 1,000 mg by mouth daily.    [provider]  meloxicam (MOBIC) 15 MG tablet Take 1 tablet (15 mg total) by mouth daily. Prn pain 05/28/19   Anders SimmondsMcClung, Angela M, PA-C  Multiple Vitamins-Minerals (AIRBORNE GUMMIES) CHEW Chew 1 each by mouth daily.    [provider]  omega-3 acid ethyl esters (LOVAZA) 1 g capsule Take 2 capsules (2 g total) by mouth 2 (two) times daily. Patient not taking: Reported on 09/17/2018 10/24/17   Jake BatheSkains, Mark C, MD    Family History Family History  Problem Relation Age of Onset   Cancer Mother        STOMACH..COLON POLYPS   Heart attack Father    Hypertension Sister    Hypercalcemia Brother    Hypertension Brother     Social History Social History   Tobacco Use   Smoking status: Every Day    Packs/day: .25    Types:  Cigarettes   Smokeless tobacco: Never  Vaping Use   Vaping Use: Never used  Substance Use Topics   Alcohol use: Yes    Alcohol/week: 14.0 standard drinks of alcohol    Types: 14 Shots of liquor per week   Drug use: Never     Allergies   Patient has no known allergies.   Review of Systems Review of Systems  Musculoskeletal:  Positive for joint swelling (right big toe red and painful).     Physical Exam Triage Vital Signs ED Triage Vitals  Enc Vitals Group     BP 04/15/22 1229 (!) 167/97     Pulse Rate 04/15/22 1229 84     Resp 04/15/22 1229 16     Temp 04/15/22 1229 97.9 F (36.6 C)     Temp Source 04/15/22 1229 Oral     SpO2 04/15/22 1229 97 %     Weight --      Height --      Head Circumference --      Peak Flow --      Pain Score 04/15/22 1232 3     Pain Loc --      Pain Edu? --      Excl. in GC? --    No data found.  Updated Vital Signs BP (!) 167/97 Comment: states has not taken HTN med yet today  Pulse 84   Temp 97.9 F (36.6 C) (Oral)   Resp 16   SpO2 97%   Visual Acuity Right Eye Distance:   Left Eye Distance:   Bilateral Distance:    Right Eye Near:   Left Eye Near:    Bilateral Near:     Physical Exam Constitutional:      Appearance: Normal appearance.  Musculoskeletal:       Feet:  Feet:     Comments: Right foot: Mild redness with less than 1+ swelling at the distal first and TP joint area, there is no streaking, there is no evidence of insect bite, breaks in the skin, or lesions.  Mild pain with range of motion.  The rest of the dorsum of the foot is normal. Neurological:     Mental Status: He is alert.      UC Treatments / Results  Labs (all labs ordered are listed, but only abnormal results are displayed) Labs Reviewed  URIC ACID    EKG   Radiology No results found.  Procedures Procedures (including critical care time)  Medications Ordered in UC Medications - No data to display  Initial Impression / Assessment  and Plan / UC Course  I have reviewed the triage vital signs and the nursing notes.  Pertinent labs & imaging results that were available during my care of the patient were reviewed by me and considered in my medical decision making (see chart for details).    Plan: The diagnosis will be treated with the following: 1.  Right foot pain: A.  Naprosyn 375 mg every 12 hours with food to help reduce pain and swelling. 2.  Gouty arthritis of toe right foot: A.  Prednisone 10 mg 3 times a day for 3 days only to help reduce the acute inflammatory process. B.  Uric acid lab result is pending. C.  Patient advised to continue using Epsom salt soaks on a regular basis. 3.  Patient advised follow-up PCP return to urgent care as needed. 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 Advised take Naprosyn 375 mg with food every 12 hours to help reduce pain and swelling. Advised take prednisone 10 mg 3 times a day for 3 days only.  Lab results will be completed in 48 hours.  If you do not get a call from this office that indicates the labs are negative.  Log onto MyChart to be the test results when they post in 48 hours.  Advised follow-up PCP return to urgent care as needed.    ED Prescriptions     Medication Sig Dispense Auth. Provider   naproxen (NAPROSYN) 375 MG tablet Take 1 tablet (375 mg total) by mouth 2 (two) times daily. 14 tablet Ellsworth Lennox, PA-C   predniSONE (DELTASONE) 10 MG tablet Take 1 tablet (10 mg total) by mouth in the morning, at noon, and at bedtime. 10 tablet Ellsworth Lennox, PA-C      PDMP not reviewed this encounter.   Ellsworth Lennox, PA-C 04/15/22 1327

## 2022-04-15 NOTE — Discharge Instructions (Signed)
Advised to continue using Epsom salt soaks, 10 minutes 3-4 times throughout the day to help relieve pain and swelling. Advised take Naprosyn 375 mg with food every 12 hours to help reduce pain and swelling. Advised take prednisone 10 mg 3 times a day for 3 days only.  Lab results will be completed in 48 hours.  If you do not get a call from this office that indicates the labs are negative.  Log onto MyChart to be the test results when they post in 48 hours.  Advised follow-up PCP return to urgent care as needed.

## 2022-04-15 NOTE — ED Triage Notes (Signed)
C/O waking up with pain and swelling in right medial distal foot and great today yesterday. Denies any known injury or hx of gout. Has soaked in Epsom salt with some improvement. Redness and swelling noted to site. RLE CMS intact.

## 2023-02-06 ENCOUNTER — Emergency Department (HOSPITAL_COMMUNITY): Payer: Self-pay

## 2023-02-06 ENCOUNTER — Other Ambulatory Visit: Payer: Self-pay

## 2023-02-06 ENCOUNTER — Emergency Department (HOSPITAL_COMMUNITY)
Admission: EM | Admit: 2023-02-06 | Discharge: 2023-02-06 | Discharge status: Against Medical Advice | Payer: Self-pay | Attending: Emergency Medicine | Admitting: Emergency Medicine

## 2023-02-06 ENCOUNTER — Encounter (HOSPITAL_COMMUNITY): Payer: Self-pay | Admitting: *Deleted

## 2023-02-06 DIAGNOSIS — R55 Syncope and collapse: Secondary | ICD-10-CM | POA: Insufficient documentation

## 2023-02-06 DIAGNOSIS — W1830XA Fall on same level, unspecified, initial encounter: Secondary | ICD-10-CM | POA: Insufficient documentation

## 2023-02-06 DIAGNOSIS — M542 Cervicalgia: Secondary | ICD-10-CM | POA: Insufficient documentation

## 2023-02-06 DIAGNOSIS — R0789 Other chest pain: Secondary | ICD-10-CM | POA: Insufficient documentation

## 2023-02-06 DIAGNOSIS — Z5321 Procedure and treatment not carried out due to patient leaving prior to being seen by health care provider: Secondary | ICD-10-CM | POA: Insufficient documentation

## 2023-02-06 LAB — COMPREHENSIVE METABOLIC PANEL
ALT: 51 U/L — ABNORMAL HIGH (ref 0–44)
AST: 63 U/L — ABNORMAL HIGH (ref 15–41)
Albumin: 4.2 g/dL (ref 3.5–5.0)
Alkaline Phosphatase: 84 U/L (ref 38–126)
Anion gap: 14 (ref 5–15)
BUN: 12 mg/dL (ref 6–20)
CO2: 21 mmol/L — ABNORMAL LOW (ref 22–32)
Calcium: 9.4 mg/dL (ref 8.9–10.3)
Chloride: 102 mmol/L (ref 98–111)
Creatinine, Ser: 0.56 mg/dL — ABNORMAL LOW (ref 0.61–1.24)
GFR, Estimated: >60 mL/min (ref >60)
Glucose, Bld: 107 mg/dL — ABNORMAL HIGH (ref 70–99)
Potassium: 3.2 mmol/L — ABNORMAL LOW (ref 3.5–5.1)
Sodium: 137 mmol/L (ref 135–145)
Total Bilirubin: 1.2 mg/dL (ref 0.0–1.2)
Total Protein: 7.8 g/dL (ref 6.5–8.1)

## 2023-02-06 LAB — CBC WITH DIFFERENTIAL/PLATELET
Abs Immature Granulocytes: 0.03 10*3/uL (ref 0.00–0.07)
Basophils Absolute: 0 10*3/uL (ref 0.0–0.1)
Basophils Relative: 0 %
Eosinophils Absolute: 0.1 10*3/uL (ref 0.0–0.5)
Eosinophils Relative: 1 %
HCT: 42.2 % (ref 39.0–52.0)
Hemoglobin: 14.5 g/dL (ref 13.0–17.0)
Immature Granulocytes: 0 %
Lymphocytes Relative: 53 %
Lymphs Abs: 4.2 10*3/uL — ABNORMAL HIGH (ref 0.7–4.0)
MCH: 33.4 pg (ref 26.0–34.0)
MCHC: 34.4 g/dL (ref 30.0–36.0)
MCV: 97.2 fL (ref 80.0–100.0)
Monocytes Absolute: 0.4 10*3/uL (ref 0.1–1.0)
Monocytes Relative: 5 %
Neutro Abs: 3.3 10*3/uL (ref 1.7–7.7)
Neutrophils Relative %: 41 %
Platelets: 171 10*3/uL (ref 150–400)
RBC: 4.34 MIL/uL (ref 4.22–5.81)
RDW: 11.5 % (ref 11.5–15.5)
WBC: 8 10*3/uL (ref 4.0–10.5)
nRBC: 0 % (ref 0.0–0.2)

## 2023-02-06 LAB — ETHANOL: Alcohol, Ethyl (B): 283 mg/dL — ABNORMAL HIGH

## 2023-02-06 NOTE — ED Notes (Addendum)
Pt not responding for vitals

## 2023-02-06 NOTE — ED Notes (Signed)
Pt family member requesting a c-collar stating that pt had unwitnessed fall and might've hit his head.

## 2023-02-06 NOTE — ED Triage Notes (Signed)
Pt arrived with nephew form home; has been having 2-3 syncopal episodes at home for the past 2 months. Pt had a fall and hit his chest on the sharp corner; c/o R chest pain. C/o chest pressure and neck pain, unsure if he hit his head when he fell as fall was unwitnessed.
